# Patient Record
Sex: Female | Born: 1980 | Race: White | Hispanic: No | Marital: Single | Smoking: Current every day smoker
Health system: Southern US, Community
[De-identification: ages and names within clinical notes are randomized; demographics above are authoritative.]

## PROBLEM LIST (undated history)

## (undated) DIAGNOSIS — F4310 Post-traumatic stress disorder, unspecified: Secondary | ICD-10-CM

## (undated) DIAGNOSIS — M7730 Calcaneal spur, unspecified foot: Secondary | ICD-10-CM

## (undated) DIAGNOSIS — F419 Anxiety disorder, unspecified: Secondary | ICD-10-CM

## (undated) DIAGNOSIS — N730 Acute parametritis and pelvic cellulitis: Secondary | ICD-10-CM

## (undated) DIAGNOSIS — N200 Calculus of kidney: Secondary | ICD-10-CM

## (undated) DIAGNOSIS — R091 Pleurisy: Secondary | ICD-10-CM

## (undated) DIAGNOSIS — S322XXA Fracture of coccyx, initial encounter for closed fracture: Secondary | ICD-10-CM

## (undated) DIAGNOSIS — F909 Attention-deficit hyperactivity disorder, unspecified type: Secondary | ICD-10-CM

## (undated) DIAGNOSIS — T7840XA Allergy, unspecified, initial encounter: Secondary | ICD-10-CM

## (undated) DIAGNOSIS — G4700 Insomnia, unspecified: Secondary | ICD-10-CM

## (undated) DIAGNOSIS — M79672 Pain in left foot: Secondary | ICD-10-CM

## (undated) HISTORY — DX: Attention-deficit hyperactivity disorder, unspecified type: F90.9

## (undated) HISTORY — DX: Insomnia, unspecified: G47.00

## (undated) HISTORY — DX: Post-traumatic stress disorder, unspecified: F43.10

## (undated) HISTORY — DX: Fracture of coccyx, initial encounter for closed fracture: S32.2XXA

## (undated) HISTORY — DX: Calculus of kidney: N20.0

## (undated) HISTORY — PX: OTHER SURGICAL HISTORY: SHX169

## (undated) HISTORY — PX: TUBAL LIGATION: SHX77

## (undated) HISTORY — DX: Calcaneal spur, unspecified foot: M77.30

## (undated) HISTORY — DX: Anxiety disorder, unspecified: F41.9

## (undated) HISTORY — DX: Allergy, unspecified, initial encounter: T78.40XA

---

## 2004-12-19 ENCOUNTER — Ambulatory Visit: Payer: Self-pay

## 2005-01-10 ENCOUNTER — Observation Stay: Payer: Self-pay

## 2005-01-24 ENCOUNTER — Inpatient Hospital Stay: Payer: Self-pay

## 2006-03-19 ENCOUNTER — Emergency Department: Payer: Self-pay | Admitting: Emergency Medicine

## 2007-05-16 ENCOUNTER — Emergency Department: Payer: Self-pay | Admitting: Emergency Medicine

## 2007-07-04 ENCOUNTER — Emergency Department: Payer: Self-pay | Admitting: Emergency Medicine

## 2007-11-18 ENCOUNTER — Other Ambulatory Visit: Payer: Self-pay

## 2007-11-18 ENCOUNTER — Emergency Department: Payer: Self-pay | Admitting: Emergency Medicine

## 2008-02-29 ENCOUNTER — Emergency Department: Payer: Self-pay | Admitting: Emergency Medicine

## 2008-09-24 ENCOUNTER — Emergency Department: Payer: Self-pay | Admitting: Emergency Medicine

## 2015-09-10 ENCOUNTER — Encounter: Payer: Self-pay | Admitting: Emergency Medicine

## 2015-09-10 ENCOUNTER — Emergency Department
Admission: EM | Admit: 2015-09-10 | Discharge: 2015-09-10 | Disposition: A | Discharge status: Home / Self Care | Payer: Medicaid Other | Attending: Emergency Medicine | Admitting: Emergency Medicine

## 2015-09-10 ENCOUNTER — Emergency Department: Payer: Medicaid Other

## 2015-09-10 DIAGNOSIS — R109 Unspecified abdominal pain: Secondary | ICD-10-CM

## 2015-09-10 DIAGNOSIS — M549 Dorsalgia, unspecified: Secondary | ICD-10-CM | POA: Diagnosis not present

## 2015-09-10 DIAGNOSIS — R1032 Left lower quadrant pain: Secondary | ICD-10-CM | POA: Insufficient documentation

## 2015-09-10 DIAGNOSIS — R319 Hematuria, unspecified: Secondary | ICD-10-CM | POA: Insufficient documentation

## 2015-09-10 DIAGNOSIS — Z3202 Encounter for pregnancy test, result negative: Secondary | ICD-10-CM | POA: Diagnosis not present

## 2015-09-10 DIAGNOSIS — Z720 Tobacco use: Secondary | ICD-10-CM | POA: Diagnosis not present

## 2015-09-10 LAB — URINALYSIS COMPLETE WITH MICROSCOPIC (ARMC ONLY)
Bilirubin Urine: NEGATIVE
Glucose, UA: NEGATIVE mg/dL
KETONES UR: NEGATIVE mg/dL
Nitrite: NEGATIVE
PH: 8 (ref 5.0–8.0)
Protein, ur: NEGATIVE mg/dL
Specific Gravity, Urine: 1.012 (ref 1.005–1.030)

## 2015-09-10 LAB — COMPREHENSIVE METABOLIC PANEL
ALK PHOS: 47 U/L (ref 38–126)
ALT: 14 U/L (ref 14–54)
AST: 34 U/L (ref 15–41)
Albumin: 4 g/dL (ref 3.5–5.0)
Anion gap: 9 (ref 5–15)
BILIRUBIN TOTAL: 1.3 mg/dL — AB (ref 0.3–1.2)
BUN: 14 mg/dL (ref 6–20)
CALCIUM: 9.5 mg/dL (ref 8.9–10.3)
CHLORIDE: 106 mmol/L (ref 101–111)
CO2: 27 mmol/L (ref 22–32)
CREATININE: 0.83 mg/dL (ref 0.44–1.00)
Glucose, Bld: 81 mg/dL (ref 65–99)
Potassium: 4.1 mmol/L (ref 3.5–5.1)
Sodium: 142 mmol/L (ref 135–145)
TOTAL PROTEIN: 7.1 g/dL (ref 6.5–8.1)

## 2015-09-10 LAB — CBC WITH DIFFERENTIAL/PLATELET
BASOS ABS: 0.2 10*3/uL — AB (ref 0–0.1)
BASOS PCT: 2 %
EOS ABS: 0.4 10*3/uL (ref 0–0.7)
EOS PCT: 4 %
HCT: 38.6 % (ref 35.0–47.0)
Hemoglobin: 12.7 g/dL (ref 12.0–16.0)
LYMPHS PCT: 19 %
Lymphs Abs: 2.1 10*3/uL (ref 1.0–3.6)
MCH: 30 pg (ref 26.0–34.0)
MCHC: 33 g/dL (ref 32.0–36.0)
MCV: 90.7 fL (ref 80.0–100.0)
MONO ABS: 0.8 10*3/uL (ref 0.2–0.9)
Monocytes Relative: 8 %
Neutro Abs: 7.5 10*3/uL — ABNORMAL HIGH (ref 1.4–6.5)
Neutrophils Relative %: 67 %
PLATELETS: 319 10*3/uL (ref 150–440)
RBC: 4.25 MIL/uL (ref 3.80–5.20)
RDW: 13.7 % (ref 11.5–14.5)
WBC: 11.1 10*3/uL — AB (ref 3.6–11.0)

## 2015-09-10 LAB — PREGNANCY, URINE: PREG TEST UR: NEGATIVE

## 2015-09-10 MED ORDER — SODIUM CHLORIDE 0.9 % IV BOLUS (SEPSIS)
1000.0000 mL | Freq: Once | INTRAVENOUS | Status: AC
  Administered 2015-09-10: 1000 mL via INTRAVENOUS

## 2015-09-10 MED ORDER — KETOROLAC TROMETHAMINE 30 MG/ML IJ SOLN
30.0000 mg | Freq: Once | INTRAMUSCULAR | Status: AC
  Administered 2015-09-10: 30 mg via INTRAVENOUS
  Filled 2015-09-10: qty 1

## 2015-09-10 MED ORDER — TRAMADOL HCL 50 MG PO TABS
50.0000 mg | ORAL_TABLET | Freq: Once | ORAL | Status: AC
  Administered 2015-09-10: 50 mg via ORAL
  Filled 2015-09-10: qty 1

## 2015-09-10 NOTE — ED Notes (Addendum)
Pt states yesterday she had sudden onset back and flank pain. Pt states pain feels like kidney stone pain, hx of kidney stones at this time. Pt states she is unable to tell which side specifically d/t the pain, but states it is on both sides and in her lower back. Pt states pain with urination at this time.

## 2015-09-10 NOTE — ED Provider Notes (Addendum)
[facility] Emergency Department Provider Note  ____________________________________________   I have reviewed the triage vital signs and the nursing notes.   HISTORY  Chief Complaint Flank Pain and Back Pain    HPI Caitlin Reid is a 34 y.o. female with a history of kidney stones states that she believes she has a kidney stone. Sudden onset left flank and back pain. She denies dysuria or urinary frequency. She has had no fever no chills. She felt nauseated but had no vomiting. Has had kidney stones in the past. Feels similar to that.Denies history of ovarian cyst, has not missed a period, has a history of bilateral tubal ligation.  History reviewed. No pertinent past medical history.  There are no active problems to display for this patient.   Past Surgical History  Procedure Laterality Date  . Cesarean section    . Bone spur    . Tubal ligation      No current outpatient prescriptions on file.  Allergies Tetracyclines & related and Doxycycline  History reviewed. No pertinent family history.  Social History Social History  Substance Use Topics  . Smoking status: Current Every Day Smoker  . Smokeless tobacco: Never Used  . Alcohol Use: No    Review of Systems Constitutional: No fever/chills Eyes: No visual changes. ENT: No sore throat. No stiff neck no neck pain Cardiovascular: Denies chest pain. Respiratory: Denies shortness of breath. Gastrointestinal:   no vomiting.  No diarrhea.  No constipation. Genitourinary: Negative for dysuria. Musculoskeletal: Negative lower extremity swelling Skin: Negative for rash. Neurological: Negative for headaches, focal weakness or numbness. 10-point ROS otherwise negative.  ____________________________________________   PHYSICAL EXAM:  VITAL SIGNS: ED Triage Vitals  Enc Vitals Group     BP 09/10/15 1320 130/86 mmHg     Pulse Rate 09/10/15 1320 112     Resp 09/10/15 1320 24     Temp  09/10/15 1320 98.2 F (36.8 C)     Temp Source 09/10/15 1320 Oral     SpO2 09/10/15 1320 100 %     Weight 09/10/15 1320 150 lb (68.04 kg)     Height 09/10/15 1320 5\' 2"  (1.575 m)     Head Cir --      Peak Flow --      Pain Score 09/10/15 1320 10     Pain Loc --      Pain Edu? --      Excl. in GC? --     Constitutional: Alert and oriented. Well appearing and in no acute distress. Appears uncomfortable but nontoxic Eyes: Conjunctivae are normal. PERRL. EOMI. Head: Atraumatic. Nose: No congestion/rhinnorhea. Mouth/Throat: Mucous membranes are moist.  Oropharynx non-erythematous. Neck: No stridor.   Nontender with no meningismus Cardiovascular: Normal rate, regular rhythm. Grossly normal heart sounds.  Good peripheral circulation. Respiratory: Normal respiratory effort.  No retractions. Lungs CTAB. Gastrointestinal: Soft , left-sided abdominal discomfort noted lower. No distention. No guarding no rebound Back:  There is no focal tenderness or step off there is no midline tenderness there are no lesions noted. there is left CVA tenderness Musculoskeletal: No lower extremity tenderness. No joint effusions, no DVT signs strong distal pulses no edema Neurologic:  Normal speech and language. No gross focal neurologic deficits are appreciated.  Skin:  Skin is warm, dry and intact. No rash noted. Psychiatric: Mood and affect are normal. Speech and behavior are normal.  ____________________________________________   LABS (all labs ordered are listed, but only abnormal results are displayed)  Labs Reviewed  URINALYSIS COMPLETEWITH MICROSCOPIC ([facility] ONLY) - Abnormal; Notable for the following:    Color, Urine YELLOW (*)    APPearance HAZY (*)    Hgb urine dipstick 3+ (*)    Leukocytes, UA TRACE (*)    Bacteria, UA RARE (*)    Squamous Epithelial / LPF 0-5 (*)    All other components within normal limits  CBC WITH DIFFERENTIAL/PLATELET  COMPREHENSIVE METABOLIC PANEL  PREGNANCY, URINE   POC URINE PREG, ED  POC URINE PREG, ED   ____________________________________________  EKG   ____________________________________________  RADIOLOGY  Personally reviewed ____________________________________________   PROCEDURES  Procedure(s) performed: None  Critical Care performed: None  ____________________________________________   INITIAL IMPRESSION / ASSESSMENT AND PLAN / ED COURSE  Pertinent labs & imaging results that were available during my care of the patient were reviewed by me and considered in my medical decision making (see chart for details).  Patient presents with hematuria, flank pain, lower left abdominal discomfort. Differential does include kidney stone is most likely also is possible diverticulitis, ovarian cyst or possible as well although lower suspicion for all of these entities aside from kidney stone. We'll obtain imaging given that I do not have any recent imaging for this patient and we will check blood work give her pain medications and reassess. ____________________________________________   ----------------------------------------- 4:05 PM on 09/10/2015 -----------------------------------------  Patient texting on her phone in no acute distress. Vital signs are reassuring. There is hematuria but no evidence of UTI and culture will be sent. No dysuria no urinary frequency. Patient requesting "something stronger" for pain. CT scan shows no evidence of obstructive stone or ureteral stone. Aside from hematuria patient's presentation thus far as reassuring. I did offer her a pelvic ultrasound and GU exam to rule out ovarian pathology but she declines at this time. She knows I cannot fully evaluate the structures without further investigation but she would prefer not to have that. CT is normal for ovarian pathology in any event to the extent that that is helpful. I have investigated the patient's [place] [facility] provider database for the last 2 years. 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 This is not to mention the numerous prescriptions for alprazolam, Adderall, given this extensive controlled substance history I do not feel comfortable giving the patient a prescription for narcotic pain medication at this time.   FINAL CLINICAL IMPRESSION(S) / ED DIAGNOSES  Final diagnoses:  Flank pain     Jeanmarie Plant, MD 09/10/15 1411  Jeanmarie Plant, MD 09/10/15 [phone]

## 2015-09-10 NOTE — ED Notes (Signed)
Discussed discharge instructions and follow-up care with patient. No questions or concerns at this time. Pt stable at discharge.  

## 2015-09-10 NOTE — Discharge Instructions (Signed)
Flank Pain °Flank pain refers to pain that is located on the side of the body between the upper abdomen and the back. The pain may occur over a short period of time (acute) or may be long-term or reoccurring (chronic). It may be mild or severe. Flank pain can be caused by many things. °CAUSES  °Some of the more common causes of flank pain include: °· Muscle strains.   °· Muscle spasms.   °· A disease of your spine (vertebral disk disease).   °· A lung infection (pneumonia).   °· Fluid around your lungs (pulmonary edema).   °· A kidney infection.   °· Kidney stones.   °· A very painful skin rash caused by the chickenpox virus (shingles).   °· Gallbladder disease.   °HOME CARE INSTRUCTIONS  °Home care will depend on the cause of your pain. In general, °· Rest as directed by your caregiver. °· Drink enough fluids to keep your urine clear or pale yellow. °· Only take over-the-counter or prescription medicines as directed by your caregiver. Some medicines may help relieve the pain. °· Tell your caregiver about any changes in your pain. °· Follow up with your caregiver as directed. °SEEK IMMEDIATE MEDICAL CARE IF:  °· Your pain is not controlled with medicine.   °· You have new or worsening symptoms. °· Your pain increases.   °· You have abdominal pain.   °· You have shortness of breath.   °· You have persistent nausea or vomiting.   °· You have swelling in your abdomen.   °· You feel faint or pass out.   °· You have blood in your urine. °· You have a fever or persistent symptoms for more than 2-3 days. °· You have a fever and your symptoms suddenly get worse. °MAKE SURE YOU:  °· Understand these instructions. °· Will watch your condition. °· Will get help right away if you are not doing well or get worse. °  °This information is not intended to replace advice given to you by your health care provider. Make sure you discuss any questions you have with your health care provider. °  °Document Released: 12/26/2005 Document  Revised: 07/29/2012 Document Reviewed: 06/18/2012 °Elsevier Interactive Patient Education ©2016 Else[employer] ° °

## 2015-09-15 ENCOUNTER — Ambulatory Visit
Admission: EM | Admit: 2015-09-15 | Discharge: 2015-09-15 | Disposition: A | Discharge status: Home / Self Care | Payer: Medicaid Other | Attending: Family Medicine | Admitting: Family Medicine

## 2015-09-15 DIAGNOSIS — R0981 Nasal congestion: Secondary | ICD-10-CM | POA: Diagnosis not present

## 2015-09-15 DIAGNOSIS — J069 Acute upper respiratory infection, unspecified: Secondary | ICD-10-CM | POA: Diagnosis not present

## 2015-09-15 HISTORY — DX: Pleurisy: R09.1

## 2015-09-15 MED ORDER — BENZONATATE 100 MG PO CAPS: 100.0000 mg | ORAL_CAPSULE | Freq: Three times a day (TID) | ORAL | Status: DC

## 2015-09-15 MED ORDER — FLUTICASONE PROPIONATE 50 MCG/ACT NA SUSP: 1.0000 | Freq: Every day | NASAL | Status: DC

## 2015-09-15 MED ORDER — PREDNISONE 10 MG PO TABS: 40.0000 mg | ORAL_TABLET | Freq: Every day | ORAL | Status: DC

## 2015-09-15 NOTE — Discharge Instructions (Signed)
Upper Respiratory Infection, Adult Most upper respiratory infections (URIs) are caused by a virus. A URI affects the nose, throat, and upper air passages. The most common type of URI is often called "the common cold." HOME CARE   Take medicines only as told by your doctor.  Gargle warm saltwater or take cough drops to comfort your throat as told by your doctor.  Use a warm mist humidifier or inhale steam from a shower to increase air moisture. This may make it easier to breathe.  Drink enough fluid to keep your pee (urine) clear or pale yellow.  Eat soups and other clear broths.  Have a healthy diet.  Rest as needed.  Go back to work when your fever is gone or your doctor says it is okay.  You may need to stay home longer to avoid giving your URI to others.  You can also wear a face mask and wash your hands often to prevent spread of the virus.  Use your inhaler more if you have asthma.  Do not use any tobacco products, including cigarettes, chewing tobacco, or electronic cigarettes. If you need help quitting, ask your doctor. GET HELP IF:  You are getting worse, not better.  Your symptoms are not helped by medicine.  You have chills.  You are getting more short of breath.  You have brown or red mucus.  You have yellow or brown discharge from your nose.  You have pain in your face, especially when you bend forward.  You have a fever.  You have puffy (swollen) neck glands.  You have pain while swallowing.  You have white areas in the back of your throat. GET HELP RIGHT AWAY IF:   You have very bad or constant:  Headache.  Ear pain.  Pain in your forehead, behind your eyes, and over your cheekbones (sinus pain).  Chest pain.  You have long-lasting (chronic) lung disease and any of the following:  Wheezing.  Long-lasting cough.  Coughing up blood.  A change in your usual mucus.  You have a stiff neck.  You have changes in  your:  Vision.  Hearing.  Thinking.  Mood. MAKE SURE YOU:   Understand these instructions.  Will watch your condition.  Will get help right away if you are not doing well or get worse.   This information is not intended to replace advice given to you by your health care provider. Make sure you discuss any questions you have with your health care provider.  Your vital signs or normal and your exam supports treating you for URI, viral illness. No antibiotics are indicated and will not help symptoms at present.  Rest, push fluids, take OTC meds for s/s management. Take Prednsione, flonase, tessalon perles as directed. Return in 3 days for recheck if symptoms have not improved or resolved. Go to ER for new or worsening issues or concerns.    Document Released: 04/22/2008 Document Revised: 03/21/2015 Document Reviewed: 02/09/2014 Elsevier Interactive Patient Education [employer]

## 2015-09-15 NOTE — ED Notes (Signed)
Patient states that 3 days ago she developed a cough, with congestion and she states that she feels like she cannot breathe because she has so much nasal congestion. She states that she has had a headache, runny nose, and wheezing. She states that her symptoms have gotten progressively worse.

## 2015-09-15 NOTE — ED Provider Notes (Signed)
CSN: [id number]     Arrival date & time 09/15/15  1606 History   First MD Initiated Contact with Patient 09/15/15 1746     Chief Complaint  Patient presents with  . URI   (Consider location/radiation/quality/duration/timing/severity/associated sxs/prior Treatment) HPI Comments: Pt states she just moved here form [place]. Was recently seen in ER( 5 days prior) for unrelated complaint. Pt states she stopped smoking weeks ago. Deneis recent illness expsoure  Patient is a 34 y.o. female presenting with URI. The history is provided by the patient. No language interpreter was used.  URI Presenting symptoms: congestion, cough and sore throat   Presenting symptoms: no fever   Severity:  Moderate Onset quality:  Sudden Duration:  3 days Timing:  Constant Progression:  Worsening Chronicity:  Recurrent Relieved by:  Nothing Ineffective treatments:  Rest and OTC medications Associated symptoms: myalgias and sneezing   Associated symptoms: no arthralgias, no headaches, no neck pain, no sinus pain, no swollen glands and no wheezing   Risk factors: no sick contacts     Past Medical History  Diagnosis Date  . Pleurisy     Patient reports that she has had this twice   Past Surgical History  Procedure Laterality Date  . Cesarean section    . Bone spur    . Tubal ligation     History reviewed. No pertinent family history. Social History  Substance Use Topics  . Smoking status: Current Every Day Smoker  . Smokeless tobacco: Never Used  . Alcohol Use: No   OB History    No data available     Review of Systems  Constitutional: Positive for chills. Negative for fever.  HENT: Positive for congestion, sinus pressure, sneezing and sore throat.   Eyes: Negative.   Respiratory: Positive for cough. Negative for choking, shortness of breath and wheezing.   Cardiovascular: Negative.   Gastrointestinal: Negative.   Endocrine: Negative.   Genitourinary: Negative.   Musculoskeletal:  Positive for myalgias. Negative for arthralgias and neck pain.  Skin: Negative for rash.  Allergic/Immunologic: Positive for environmental allergies.  Neurological: Negative for headaches.  Hematological: Negative.   Psychiatric/Behavioral: Negative.   All other systems reviewed and are negative.   Allergies  Doxycycline; Tetracyclines & related; and Other  Home Medications   Prior to Admission medications   Medication Sig Start Date End Date Taking? Authorizing Provider  Multiple Vitamins-Minerals (MULTIVITAMIN GUMMIES ADULT PO) Take 2 each by mouth daily.   Yes Historical Provider, MD  benzonatate (TESSALON) 100 MG capsule Take 1 capsule (100 mg total) by mouth every 8 (eight) hours. 09/15/15   Abbagale Goguen, NP  fluticasone (FLONASE) 50 MCG/ACT nasal spray Place 1 spray into both nostrils daily. 09/15/15   Clancy Gourd, NP  predniSONE (DELTASONE) 10 MG tablet Take 4 tablets (40 mg total) by mouth daily. 09/15/15   Clancy Gourd, NP   Meds Ordered and Administered this Visit  Medications - No data to display  BP 134/79 mmHg  Pulse 84  Temp(Src) 97.5 F (36.4 C) (Tympanic)  Resp 18  Ht 5\' 3"  (1.6 m)  Wt 150 lb (68.04 kg)  BMI 26.58 kg/m2  SpO2 99%  LMP 08/30/2015 (Approximate) No data found.   Physical Exam  Constitutional: She is oriented to person, place, and time. She appears well-developed and well-nourished. She is active and cooperative. No distress.  HENT:  Head: Normocephalic.  Right Ear: Tympanic membrane is retracted.  Left Ear: Tympanic membrane is retracted.  Nose: Mucosal edema present. No  rhinorrhea. Right sinus exhibits no maxillary sinus tenderness and no frontal sinus tenderness. Left sinus exhibits no maxillary sinus tenderness and no frontal sinus tenderness.  Mouth/Throat: Uvula is midline and oropharynx is clear and moist.  Eyes: Conjunctivae, EOM and lids are normal. Pupils are equal, round, and reactive to light.  Neck: Normal range  of motion. No tracheal deviation present.  Cardiovascular: Normal rate, regular rhythm, normal heart sounds and normal pulses.   No murmur heard. Pulmonary/Chest: Effort normal and breath sounds normal. She has no decreased breath sounds.  Abdominal: Soft. Bowel sounds are normal. There is no tenderness.  Musculoskeletal: Normal range of motion.  Lymphadenopathy:    She has no cervical adenopathy.  Neurological: She is alert and oriented to person, place, and time. No cranial nerve deficit or sensory deficit. GCS eye subscore is 4. GCS verbal subscore is 5. GCS motor subscore is 6.  Skin: Skin is warm and dry. No rash noted.  Psychiatric: She has a normal mood and affect. Her speech is normal and behavior is normal.  Nursing note and vitals reviewed.   ED Course  Procedures (including critical care time)  Labs Review Labs Reviewed - No data to display  Imaging Review No results found.    MDM   1. URI (upper respiratory infection)   2. Nasal congestion     Your vital signs or normal and your exam supports treating you for URI, viral illness. No antibiotics are indicated and will not help symptoms at present.  Rest, push fluids, take OTC meds for s/s management. Take Prednsione, flonase, tessalon perles as directed. Return in 3 days for recheck if symptoms have not improved or resolved. Go to ER for new or worsening issues or concerns. Pt verbalized understanding to this provider.  Clancy Gourd, NP 09/15/15 2135

## 2015-10-31 ENCOUNTER — Encounter: Payer: Self-pay | Admitting: Emergency Medicine

## 2015-10-31 ENCOUNTER — Emergency Department: Payer: Medicaid Other

## 2015-10-31 ENCOUNTER — Emergency Department
Admission: EM | Admit: 2015-10-31 | Discharge: 2015-10-31 | Disposition: A | Discharge status: Home / Self Care | Payer: Medicaid Other | Attending: Emergency Medicine | Admitting: Emergency Medicine

## 2015-10-31 DIAGNOSIS — Y998 Other external cause status: Secondary | ICD-10-CM | POA: Diagnosis not present

## 2015-10-31 DIAGNOSIS — Y9289 Other specified places as the place of occurrence of the external cause: Secondary | ICD-10-CM | POA: Insufficient documentation

## 2015-10-31 DIAGNOSIS — F17210 Nicotine dependence, cigarettes, uncomplicated: Secondary | ICD-10-CM | POA: Diagnosis not present

## 2015-10-31 DIAGNOSIS — W108XXA Fall (on) (from) other stairs and steps, initial encounter: Secondary | ICD-10-CM | POA: Insufficient documentation

## 2015-10-31 DIAGNOSIS — S3992XA Unspecified injury of lower back, initial encounter: Secondary | ICD-10-CM | POA: Diagnosis present

## 2015-10-31 DIAGNOSIS — S20229A Contusion of unspecified back wall of thorax, initial encounter: Secondary | ICD-10-CM

## 2015-10-31 DIAGNOSIS — Z7952 Long term (current) use of systemic steroids: Secondary | ICD-10-CM | POA: Insufficient documentation

## 2015-10-31 DIAGNOSIS — Z79899 Other long term (current) drug therapy: Secondary | ICD-10-CM | POA: Insufficient documentation

## 2015-10-31 DIAGNOSIS — Y9389 Activity, other specified: Secondary | ICD-10-CM | POA: Diagnosis not present

## 2015-10-31 DIAGNOSIS — Z7951 Long term (current) use of inhaled steroids: Secondary | ICD-10-CM | POA: Insufficient documentation

## 2015-10-31 DIAGNOSIS — S299XXA Unspecified injury of thorax, initial encounter: Secondary | ICD-10-CM | POA: Insufficient documentation

## 2015-10-31 DIAGNOSIS — Z3202 Encounter for pregnancy test, result negative: Secondary | ICD-10-CM | POA: Insufficient documentation

## 2015-10-31 DIAGNOSIS — S300XXA Contusion of lower back and pelvis, initial encounter: Secondary | ICD-10-CM | POA: Insufficient documentation

## 2015-10-31 LAB — POCT PREGNANCY, URINE: PREG TEST UR: NEGATIVE

## 2015-10-31 MED ORDER — IBUPROFEN 800 MG PO TABS
800.0000 mg | ORAL_TABLET | Freq: Once | ORAL | Status: AC
  Administered 2015-10-31: 800 mg via ORAL
  Filled 2015-10-31: qty 1

## 2015-10-31 MED ORDER — TRAMADOL HCL 50 MG PO TABS: 50.0000 mg | ORAL_TABLET | Freq: Four times a day (QID) | ORAL | Status: DC | PRN

## 2015-10-31 MED ORDER — ONDANSETRON 8 MG PO TBDP
8.0000 mg | ORAL_TABLET | Freq: Once | ORAL | Status: AC
  Administered 2015-10-31: 8 mg via ORAL
  Filled 2015-10-31: qty 1

## 2015-10-31 MED ORDER — IBUPROFEN 800 MG PO TABS: 800.0000 mg | ORAL_TABLET | Freq: Three times a day (TID) | ORAL | Status: DC | PRN

## 2015-10-31 MED ORDER — TRAMADOL HCL 50 MG PO TABS
50.0000 mg | ORAL_TABLET | Freq: Once | ORAL | Status: AC
  Administered 2015-10-31: 50 mg via ORAL
  Filled 2015-10-31: qty 1

## 2015-10-31 MED ORDER — CYCLOBENZAPRINE HCL 10 MG PO TABS: 10.0000 mg | ORAL_TABLET | Freq: Three times a day (TID) | ORAL | Status: DC | PRN

## 2015-10-31 MED ORDER — ONDANSETRON HCL 4 MG PO TABS: 4.0000 mg | ORAL_TABLET | Freq: Three times a day (TID) | ORAL | Status: DC | PRN

## 2015-10-31 NOTE — ED Notes (Signed)
AAOx3.  Skin warm and dry.  Ambulates with easy and steady gait.  Posture more upright and stiff, but improved.  D/C home

## 2015-10-31 NOTE — ED Provider Notes (Signed)
[facility] Emergency Department Provider Note  ____________________________________________  Time seen: Approximately 12:49 PM  I have reviewed the triage vital signs and the nursing notes.   HISTORY  Chief Complaint Fall and Back Pain    HPI Caitlin Reid is a 34 y.o. female complain of pain secondary to a fall. She was taken trash down a flight of stairs when she slipped and fell. Patient stated the pain from mid to lower back. He denies any radicular component to this pain he denies any bladder or bowel dysfunction. No palliative measures taken for this complaint. Patient is rating her pain as a 10 over 10. Patient describes the pain as sharp.   Past Medical History  Diagnosis Date  . Pleurisy     Patient reports that she has had this twice    There are no active problems to display for this patient.   Past Surgical History  Procedure Laterality Date  . Cesarean section    . Bone spur    . Tubal ligation      Current Outpatient Rx  Name  Route  Sig  Dispense  Refill  . benzonatate (TESSALON) 100 MG capsule   Oral   Take 1 capsule (100 mg total) by mouth every 8 (eight) hours.   21 capsule   0   . cyclobenzaprine (FLEXERIL) 10 MG tablet   Oral   Take 1 tablet (10 mg total) by mouth every 8 (eight) hours as needed for muscle spasms.   15 tablet   0   . fluticasone (FLONASE) 50 MCG/ACT nasal spray   Each Nare   Place 1 spray into both nostrils daily.   9.9 g   0   . ibuprofen (ADVIL,MOTRIN) 800 MG tablet   Oral   Take 1 tablet (800 mg total) by mouth every 8 (eight) hours as needed.   30 tablet   0   . ibuprofen (ADVIL,MOTRIN) 800 MG tablet   Oral   Take 1 tablet (800 mg total) by mouth every 8 (eight) hours as needed.   30 tablet   0   . Multiple Vitamins-Minerals (MULTIVITAMIN GUMMIES ADULT PO)   Oral   Take 2 each by mouth daily.         . ondansetron (ZOFRAN) 4 MG tablet   Oral   Take 1 tablet (4 mg total) by  mouth every 8 (eight) hours as needed for nausea or vomiting.   12 tablet   0   . predniSONE (DELTASONE) 10 MG tablet   Oral   Take 4 tablets (40 mg total) by mouth daily.   16 tablet   0   . traMADol (ULTRAM) 50 MG tablet   Oral   Take 1 tablet (50 mg total) by mouth every 6 (six) hours as needed for moderate pain.   12 tablet   0   . traMADol (ULTRAM) 50 MG tablet   Oral   Take 1 tablet (50 mg total) by mouth every 6 (six) hours as needed for moderate pain.   12 tablet   0     Allergies Doxycycline; Tetracyclines & related; and Other  No family history on file.  Social History Social History  Substance Use Topics  . Smoking status: Current Every Day Smoker -- 0.33 packs/day    Types: Cigarettes  . Smokeless tobacco: Never Used  . Alcohol Use: No    Review of Systems Constitutional: No fever/chills Eyes: No visual changes. ENT: No sore throat.  Cardiovascular: Denies chest pain. Respiratory: Denies shortness of breath. Gastrointestinal: No abdominal pain.  No nausea, no vomiting.  No diarrhea.  No constipation. Genitourinary: Negative for dysuria. Musculoskeletal: Positive for back pain. Skin: Negative for rash. Neurological: Negative for headaches, focal weakness or numbness. Hematological/Lymphatic: Allergic/Immunilogical: Doxycycline 10-point ROS otherwise negative.  ____________________________________________   PHYSICAL EXAM:  VITAL SIGNS: ED Triage Vitals  Enc Vitals Group     BP 10/31/15 1212 127/87 mmHg     Pulse Rate 10/31/15 1212 69     Resp 10/31/15 1212 18     Temp 10/31/15 1212 98.1 F (36.7 C)     Temp Source 10/31/15 1212 Oral     SpO2 10/31/15 1212 98 %     Weight 10/31/15 1204 150 lb (68.04 kg)     Height 10/31/15 1204 5\' 2"  (1.575 m)     Head Cir --      Peak Flow --      Pain Score 10/31/15 1205 10     Pain Loc --      Pain Edu? --      Excl. in GC? --     Constitutional: Alert and oriented. Well appearing and in no  acute distress. Eyes: Conjunctivae are normal. PERRL. EOMI. Head: Atraumatic. Nose: No congestion/rhinnorhea. Mouth/Throat: Mucous membranes are moist.  Oropharynx non-erythematous. Neck: No stridor.  No cervical spine tenderness to palpation. Hematological/Lymphatic/Immunilogical: No cervical lymphadenopathy. Cardiovascular: Normal rate, regular rhythm. Grossly normal heart sounds.  Good peripheral circulation. Respiratory: Normal respiratory effort.  No retractions. Lungs CTAB. Gastrointestinal: Soft and nontender. No distention. No abdominal bruits. No CVA tenderness. Musculoskeletal: No spinal deformity. There is no abrasion or ecchymosis. Patient has moderate guarding palpation L1-L4. Patient decreased range of motion with flexion. Patient is a 11/02/15. Neurologic:  Normal speech and language. No gross focal neurologic deficits are appreciated. No gait instability. Skin:  Skin is warm, dry and intact. No rash noted. Psychiatric: Mood and affect are normal. Speech and behavior are normal.  ____________________________________________   LABS (all labs ordered are listed, but only abnormal results are displayed)  Labs Reviewed  POCT PREGNANCY, URINE   ____________________________________________  EKG   ____________________________________________  RADIOLOGY  No acute findings for lumbar and right femur x-ray. ____________________________________________   PROCEDURES  Procedure(s) performed: None  Critical Care performed: No  ____________________________________________   INITIAL IMPRESSION / ASSESSMENT AND PLAN / ED COURSE  Pertinent labs & imaging results that were available during my care of the patient were reviewed by me and considered in my medical decision making (see chart for details).  Lumbar contusion. Skull is negative x-ray findings with patient. Patient given a prescription for tramadol, ibuprofen, and Zofran. Patient advised to follow-up  with the [facility] if condition persists.Chartered certified accountant FINAL CLINICAL IMPRESSION(S) / ED DIAGNOSES  Final diagnoses:  Back contusion, unspecified laterality, initial encounter      Marland Kitchen, PA-C 10/31/15 1346  11/02/15, PA-C 10/31/15 1402  11/02/15, MD 10/31/15 [phone]

## 2015-10-31 NOTE — Discharge Instructions (Signed)

## 2015-10-31 NOTE — ED Notes (Signed)
Moving all extremities equally and strong.  Gait is steady.  Posture forward, shoulders stiff and tense.

## 2015-10-31 NOTE — ED Notes (Signed)
Pt states she fell downstairs this am, pain in mid back.

## 2015-11-10 ENCOUNTER — Encounter: Payer: Self-pay | Admitting: Emergency Medicine

## 2015-11-10 ENCOUNTER — Ambulatory Visit: Payer: Medicaid Other

## 2015-11-10 ENCOUNTER — Ambulatory Visit
Admission: EM | Admit: 2015-11-10 | Discharge: 2015-11-10 | Disposition: A | Discharge status: Home / Self Care | Payer: Medicaid Other | Attending: Family Medicine | Admitting: Family Medicine

## 2015-11-10 DIAGNOSIS — S93402A Sprain of unspecified ligament of left ankle, initial encounter: Secondary | ICD-10-CM | POA: Insufficient documentation

## 2015-11-10 DIAGNOSIS — W109XXA Fall (on) (from) unspecified stairs and steps, initial encounter: Secondary | ICD-10-CM | POA: Insufficient documentation

## 2015-11-10 DIAGNOSIS — M79672 Pain in left foot: Secondary | ICD-10-CM | POA: Insufficient documentation

## 2015-11-10 DIAGNOSIS — F17210 Nicotine dependence, cigarettes, uncomplicated: Secondary | ICD-10-CM | POA: Insufficient documentation

## 2015-11-10 MED ORDER — NAPROXEN 500 MG PO TABS: 500.0000 mg | ORAL_TABLET | Freq: Two times a day (BID) | ORAL | Status: DC

## 2015-11-10 MED ORDER — OXYCODONE-ACETAMINOPHEN 5-325 MG PO TABS: 1.0000 | ORAL_TABLET | Freq: Three times a day (TID) | ORAL | Status: DC | PRN

## 2015-11-10 NOTE — ED Notes (Signed)
Patient c/o pain in her left ankle and foot pain after falling down three steps today.

## 2015-11-10 NOTE — ED Provider Notes (Signed)
CSN: [id number]     Arrival date & time 11/10/15  1854 History   First MD Initiated Contact with Patient 11/10/15 1912     Chief Complaint  Patient presents with  . Foot Pain  . Ankle Pain  . Fall   (Consider location/radiation/quality/duration/timing/severity/associated sxs/prior Treatment) HPI Patient presents today with complaints of left ankle and foot pain after falling down a few steps today. Patient states that her pain is medial and lateral ankle and along the arch of her foot. She admits to previous ankle sprain in the past when she played softball. She denies any known ankle fracture foot fracture in the past. She has been using ice on the area. She is unable to weight-bear without pain. She describes a pulling type of pain in the arch of the foot. There is no bruising of her ankle or foot.  Past Medical History  Diagnosis Date  . Pleurisy     Patient reports that she has had this twice   Past Surgical History  Procedure Laterality Date  . Cesarean section    . Bone spur    . Tubal ligation     History reviewed. No pertinent family history. Social History  Substance Use Topics  . Smoking status: Current Every Day Smoker -- 0.33 packs/day    Types: Cigarettes  . Smokeless tobacco: Never Used  . Alcohol Use: No   OB History    No data available     Review of Systems: Negative except mentioned above.   Allergies  Doxycycline; Tetracyclines & related; and Other  Home Medications   Prior to Admission medications   Medication Sig Start Date End Date Taking? Authorizing Provider  benzonatate (TESSALON) 100 MG capsule Take 1 capsule (100 mg total) by mouth every 8 (eight) hours. 09/15/15   Clancy Gourd, NP  cyclobenzaprine (FLEXERIL) 10 MG tablet Take 1 tablet (10 mg total) by mouth every 8 (eight) hours as needed for muscle spasms. 10/31/15   Joni Reining, PA-C  fluticasone (FLONASE) 50 MCG/ACT nasal spray Place 1 spray into both nostrils daily. 09/15/15    Clancy Gourd, NP  ibuprofen (ADVIL,MOTRIN) 800 MG tablet Take 1 tablet (800 mg total) by mouth every 8 (eight) hours as needed. 10/31/15   Joni Reining, PA-C  ibuprofen (ADVIL,MOTRIN) 800 MG tablet Take 1 tablet (800 mg total) by mouth every 8 (eight) hours as needed. 10/31/15   Joni Reining, PA-C  Multiple Vitamins-Minerals (MULTIVITAMIN GUMMIES ADULT PO) Take 2 each by mouth daily.    Historical Provider, MD  naproxen (NAPROSYN) 500 MG tablet Take 1 tablet (500 mg total) by mouth 2 (two) times daily. 11/10/15   Jolene Provost, MD  ondansetron (ZOFRAN) 4 MG tablet Take 1 tablet (4 mg total) by mouth every 8 (eight) hours as needed for nausea or vomiting. 10/31/15 10/30/16  Joni Reining, PA-C  oxyCODONE-acetaminophen (ROXICET) 5-325 MG tablet Take 1 tablet by mouth every 8 (eight) hours as needed for severe pain. 11/10/15   Jolene Provost, MD  predniSONE (DELTASONE) 10 MG tablet Take 4 tablets (40 mg total) by mouth daily. 09/15/15   Clancy Gourd, NP  traMADol (ULTRAM) 50 MG tablet Take 1 tablet (50 mg total) by mouth every 6 (six) hours as needed for moderate pain. 10/31/15   Joni Reining, PA-C  traMADol (ULTRAM) 50 MG tablet Take 1 tablet (50 mg total) by mouth every 6 (six) hours as needed for moderate pain. 10/31/15   Joni Reining, PA-C  Meds Ordered and Administered this Visit  Medications - No data to display  BP 131/72 mmHg  Pulse 99  Temp(Src) 97.6 F (36.4 C) (Tympanic)  Resp 16  Ht 5\' 2"  (1.575 m)  Wt 150 lb (68.04 kg)  BMI 27.43 kg/m2  SpO2 100%  LMP 10/24/2015 (Exact Date) No data found.   Physical Exam   GENERAL: sitting in wheelchair with mild   RESP: CTA B CARD: RRR MSK: Left Ankle/Foot- mild swelling laterally, mild tenderness laterally and medially, ROM limited due to swelling and pain, -Drawer, -Talar Tilt, no tenderness of base of 5th metatarsal, mild tenderness along arch of foot, no ecchymosis noted around foot or ankle, nv intact  NEURO: CN  II-XII grossly intact   ED Course  Procedures (including critical care time)  Labs Review Labs Reviewed - No data to display  Imaging Review Dg Ankle Complete Left  11/10/2015  CLINICAL DATA:  Status post fall with left foot and ankle pain. EXAM: LEFT ANKLE COMPLETE - 3+ VIEW COMPARISON:  None. FINDINGS: There is no evidence of fracture, dislocation, or joint effusion. There is plantar calcaneal spur. Soft tissue swelling is noted in the lateral ankle. IMPRESSION: No acute fracture or dislocation. Soft tissue swelling lateral ankle. Electronically Signed   By: 11/12/2015 M.D.   On: 11/10/2015 19:33   Dg Foot Complete Left  11/10/2015  CLINICAL DATA:  Left foot and ankle pain, fall off a step yesterday EXAM: LEFT FOOT - COMPLETE 3+ VIEW COMPARISON:  None. FINDINGS: Three views of the left foot submitted. No acute fracture or subluxation. There is plantar spur of calcaneus. IMPRESSION: No acute fracture or subluxation.  Plantar spur of calcaneus. Electronically Signed   By: 11/12/2015 M.D.   On: 11/10/2015 19:34    MDM   1. Ankle sprain, left, initial encounter   2. Foot pain, left   Discussed with patient about RICE, patient put in an ankle stirrup, crutches, she is to follow-up with me on Wednesday here unless she has acute worsening symptoms which then she will seek immediate medical attention.  It doesn't appear that she has a full plantar rupture at this time but she is sensitive in the arch of her foot. I will reassess her on Wednesday to see if she needs any further imaging. Patient given Naprosyn and narcotic medication for when necessary use.    Tuesday, MD 11/10/15 2007

## 2015-11-15 ENCOUNTER — Ambulatory Visit
Admission: EM | Admit: 2015-11-15 | Discharge: 2015-11-15 | Disposition: A | Discharge status: Home / Self Care | Payer: Medicaid Other | Attending: Family Medicine | Admitting: Family Medicine

## 2015-11-15 ENCOUNTER — Encounter: Payer: Self-pay | Admitting: Emergency Medicine

## 2015-11-15 DIAGNOSIS — S99922D Unspecified injury of left foot, subsequent encounter: Secondary | ICD-10-CM | POA: Diagnosis not present

## 2015-11-15 HISTORY — DX: Pain in left foot: M79.672

## 2015-11-15 MED ORDER — OXYCODONE-ACETAMINOPHEN 5-325 MG PO TABS: 1.0000 | ORAL_TABLET | Freq: Three times a day (TID) | ORAL | Status: DC | PRN

## 2015-11-15 NOTE — ED Provider Notes (Signed)
Patient presents today for follow-up regarding left ankle/foot injury. Patient still states that her left foot is painful. She has been wearing the ankle stirrup however has been uncomfortable and it. She states most of her pain is in the arch of her foot. Her ankle symptoms have improved. She denies any bruising of the area or any increased swelling. She denies any tingling or numbness of the foot or discoloration. X-rays were negative for any acute fracture. She states that she will need to see her PCP to get an MRI. She is alert he made an appointment to see her PCP for next week. She is currently on crutches and putting minimal weight on the left foot.  ROS: Negative except mentioned above.  Vitals as per Epic.  GENERAL: NAD sitting in wheelchair MSK: L Foot/Ankle- no ecchymosis, moderate tenderness along the arch of the foot, ankle has full range of motion, mild medial ankle tenderness, no tenderness at the base of the fifth metatarsal, NV intact  A/P: L Ankle/Foot Injury- would recommend patient have further imaging with MRI, patient will have to see PCP according to the patient to be able to get an MRI ordered, would follow-up with podiatry if she needs to see someone locally like Dr. Ether Griffins. She is more comfortable in an Ace wrap and on crutches. Doesn't appear that she would be able to tolerate a walking boot at this time. If any acute worsening symptoms she will seek medical attention as discussed. Will refill her pain medication to use for moderate to severe pain.    Jolene Provost, MD 11/15/15 [phone]

## 2015-11-15 NOTE — ED Notes (Signed)
Left foot pain recheck with Dr. Allena Katz. Pain still continuing in arch of foot shoots up ankle.

## 2015-12-07 ENCOUNTER — Emergency Department: Payer: Medicaid Other

## 2015-12-07 ENCOUNTER — Emergency Department
Admission: EM | Admit: 2015-12-07 | Discharge: 2015-12-07 | Disposition: A | Discharge status: Home / Self Care | Payer: Medicaid Other | Attending: Emergency Medicine | Admitting: Emergency Medicine

## 2015-12-07 DIAGNOSIS — N12 Tubulo-interstitial nephritis, not specified as acute or chronic: Secondary | ICD-10-CM | POA: Diagnosis not present

## 2015-12-07 DIAGNOSIS — Z7952 Long term (current) use of systemic steroids: Secondary | ICD-10-CM | POA: Diagnosis not present

## 2015-12-07 DIAGNOSIS — Z79899 Other long term (current) drug therapy: Secondary | ICD-10-CM | POA: Insufficient documentation

## 2015-12-07 DIAGNOSIS — Z7951 Long term (current) use of inhaled steroids: Secondary | ICD-10-CM | POA: Insufficient documentation

## 2015-12-07 DIAGNOSIS — R109 Unspecified abdominal pain: Secondary | ICD-10-CM | POA: Diagnosis present

## 2015-12-07 DIAGNOSIS — Z791 Long term (current) use of non-steroidal anti-inflammatories (NSAID): Secondary | ICD-10-CM | POA: Diagnosis not present

## 2015-12-07 DIAGNOSIS — F17210 Nicotine dependence, cigarettes, uncomplicated: Secondary | ICD-10-CM | POA: Diagnosis not present

## 2015-12-07 DIAGNOSIS — Z3202 Encounter for pregnancy test, result negative: Secondary | ICD-10-CM | POA: Diagnosis not present

## 2015-12-07 LAB — CBC
HCT: 45.1 % (ref 35.0–47.0)
Hemoglobin: 14.8 g/dL (ref 12.0–16.0)
MCH: 29.2 pg (ref 26.0–34.0)
MCHC: 32.9 g/dL (ref 32.0–36.0)
MCV: 88.6 fL (ref 80.0–100.0)
PLATELETS: 296 10*3/uL (ref 150–440)
RBC: 5.09 MIL/uL (ref 3.80–5.20)
RDW: 14.2 % (ref 11.5–14.5)
WBC: 20.8 10*3/uL — ABNORMAL HIGH (ref 3.6–11.0)

## 2015-12-07 LAB — BASIC METABOLIC PANEL
Anion gap: 10 (ref 5–15)
BUN: 13 mg/dL (ref 6–20)
CO2: 23 mmol/L (ref 22–32)
Calcium: 9.3 mg/dL (ref 8.9–10.3)
Chloride: 103 mmol/L (ref 101–111)
Creatinine, Ser: 0.79 mg/dL (ref 0.44–1.00)
GFR calc Af Amer: >60 mL/min (ref >60)
GLUCOSE: 111 mg/dL — AB (ref 65–99)
POTASSIUM: 4 mmol/L (ref 3.5–5.1)
Sodium: 136 mmol/L (ref 135–145)

## 2015-12-07 LAB — URINALYSIS COMPLETE WITH MICROSCOPIC (ARMC ONLY)
BACTERIA UA: NONE SEEN
BILIRUBIN URINE: NEGATIVE
Glucose, UA: NEGATIVE mg/dL
Ketones, ur: NEGATIVE mg/dL
Nitrite: NEGATIVE
PH: 7 (ref 5.0–8.0)
PROTEIN: NEGATIVE mg/dL
Specific Gravity, Urine: 1.008 (ref 1.005–1.030)

## 2015-12-07 LAB — POCT PREGNANCY, URINE: Preg Test, Ur: NEGATIVE

## 2015-12-07 MED ORDER — ONDANSETRON 8 MG PO TBDP: 8.0000 mg | ORAL_TABLET | Freq: Three times a day (TID) | ORAL | Status: DC | PRN

## 2015-12-07 MED ORDER — SODIUM CHLORIDE 0.9 % IV BOLUS (SEPSIS)
1000.0000 mL | Freq: Once | INTRAVENOUS | Status: AC
  Administered 2015-12-07: 1000 mL via INTRAVENOUS

## 2015-12-07 MED ORDER — DEXTROSE 5 % IV SOLN
1.0000 g | Freq: Once | INTRAVENOUS | Status: AC
  Administered 2015-12-07: 1 g via INTRAVENOUS
  Filled 2015-12-07 (×2): qty 10

## 2015-12-07 MED ORDER — SULFAMETHOXAZOLE-TRIMETHOPRIM 800-160 MG PO TABS: 1.0000 | ORAL_TABLET | Freq: Two times a day (BID) | ORAL | Status: DC

## 2015-12-07 MED ORDER — PHENAZOPYRIDINE HCL 200 MG PO TABS: 200.0000 mg | ORAL_TABLET | Freq: Three times a day (TID) | ORAL | Status: DC | PRN

## 2015-12-07 MED ORDER — IOHEXOL 240 MG/ML SOLN
25.0000 mL | Freq: Once | INTRAMUSCULAR | Status: AC | PRN
  Administered 2015-12-07: 25 mL via ORAL

## 2015-12-07 MED ORDER — PHENAZOPYRIDINE HCL 200 MG PO TABS
200.0000 mg | ORAL_TABLET | Freq: Once | ORAL | Status: AC
  Administered 2015-12-07: 200 mg via ORAL
  Filled 2015-12-07: qty 1

## 2015-12-07 MED ORDER — OXYCODONE-ACETAMINOPHEN 5-325 MG PO TABS
2.0000 | ORAL_TABLET | Freq: Once | ORAL | Status: AC
  Administered 2015-12-07: 2 via ORAL
  Filled 2015-12-07: qty 2

## 2015-12-07 MED ORDER — SODIUM CHLORIDE 0.9 % IV SOLN
Freq: Once | INTRAVENOUS | Status: AC
  Administered 2015-12-07: 20:00:00 via INTRAVENOUS

## 2015-12-07 MED ORDER — IOHEXOL 300 MG/ML  SOLN
100.0000 mL | Freq: Once | INTRAMUSCULAR | Status: AC | PRN
Start: 2015-12-07 — End: 2015-12-07
  Administered 2015-12-07: 100 mL via INTRAVENOUS

## 2015-12-07 MED ORDER — HYDROMORPHONE HCL 1 MG/ML IJ SOLN
1.0000 mg | Freq: Once | INTRAMUSCULAR | Status: AC
  Administered 2015-12-07: 1 mg via INTRAVENOUS
  Filled 2015-12-07: qty 1

## 2015-12-07 MED ORDER — ONDANSETRON HCL 4 MG/2ML IJ SOLN
4.0000 mg | Freq: Once | INTRAMUSCULAR | Status: AC
  Administered 2015-12-07: 4 mg via INTRAVENOUS
  Filled 2015-12-07: qty 2

## 2015-12-07 MED ORDER — OXYCODONE-ACETAMINOPHEN 5-325 MG PO TABS: 1.0000 | ORAL_TABLET | Freq: Four times a day (QID) | ORAL | Status: DC | PRN

## 2015-12-07 NOTE — ED Notes (Signed)
Pt arrived to ED with c/o right flank pain since Monday. Pt reports seen at [facility] and given diagnosis of kidney stone. Pt reports increased pain this evening. Pt reports finished pain meds yesterday.

## 2015-12-07 NOTE — ED Provider Notes (Signed)
[facility] Emergency Department Provider Note  ____________________________________________  Time seen: 9:00 PM  I have reviewed the triage vital signs and the nursing notes.   HISTORY  Chief Complaint Flank Pain    HPI Caitlin Reid is a 35 y.o. female who complains of right flank pain radiating to the right lower quadrantthat's been worsening over the past 4 or 5 days. She was seen at [facility] at the onset of the symptoms and diagnosed with a kidney stone on a noncontrast CT scan. However, despite taking pain medications and her symptoms have continued to worsen. She has loss of appetite and some vomiting. She has subjective fevers and chills.  The pain is severe, colicky and achy. Worse with movement. She also complains of severe urinary urgency and dysuria.   Past Medical History  Diagnosis Date  . Pleurisy     Patient reports that she has had this twice  . Foot pain, left      There are no active problems to display for this patient.    Past Surgical History  Procedure Laterality Date  . Cesarean section    . Bone spur    . Tubal ligation       Current Outpatient Rx  Name  Route  Sig  Dispense  Refill  . benzonatate (TESSALON) 100 MG capsule   Oral   Take 1 capsule (100 mg total) by mouth every 8 (eight) hours.   21 capsule   0   . cyclobenzaprine (FLEXERIL) 10 MG tablet   Oral   Take 1 tablet (10 mg total) by mouth every 8 (eight) hours as needed for muscle spasms.   15 tablet   0   . fluticasone (FLONASE) 50 MCG/ACT nasal spray   Each Nare   Place 1 spray into both nostrils daily.   9.9 g   0   . ibuprofen (ADVIL,MOTRIN) 800 MG tablet   Oral   Take 1 tablet (800 mg total) by mouth every 8 (eight) hours as needed.   30 tablet   0   . ibuprofen (ADVIL,MOTRIN) 800 MG tablet   Oral   Take 1 tablet (800 mg total) by mouth every 8 (eight) hours as needed.   30 tablet   0   . Multiple Vitamins-Minerals (MULTIVITAMIN  GUMMIES ADULT PO)   Oral   Take 2 each by mouth daily.         . naproxen (NAPROSYN) 500 MG tablet   Oral   Take 1 tablet (500 mg total) by mouth 2 (two) times daily.   20 tablet   0   . ondansetron (ZOFRAN ODT) 8 MG disintegrating tablet   Oral   Take 1 tablet (8 mg total) by mouth every 8 (eight) hours as needed for nausea or vomiting.   20 tablet   0   . ondansetron (ZOFRAN) 4 MG tablet   Oral   Take 1 tablet (4 mg total) by mouth every 8 (eight) hours as needed for nausea or vomiting.   12 tablet   0   . oxyCODONE-acetaminophen (ROXICET) 5-325 MG tablet   Oral   Take 1 tablet by mouth every 6 (six) hours as needed for severe pain.   12 tablet   0   . phenazopyridine (PYRIDIUM) 200 MG tablet   Oral   Take 1 tablet (200 mg total) by mouth 3 (three) times daily as needed for pain.   10 tablet   0   . predniSONE (  DELTASONE) 10 MG tablet   Oral   Take 4 tablets (40 mg total) by mouth daily.   16 tablet   0   . sulfamethoxazole-trimethoprim (BACTRIM DS) 800-160 MG tablet   Oral   Take 1 tablet by mouth 2 (two) times daily.   14 tablet   0   . traMADol (ULTRAM) 50 MG tablet   Oral   Take 1 tablet (50 mg total) by mouth every 6 (six) hours as needed for moderate pain.   12 tablet   0   . traMADol (ULTRAM) 50 MG tablet   Oral   Take 1 tablet (50 mg total) by mouth every 6 (six) hours as needed for moderate pain.   12 tablet   0      Allergies Doxycycline; Tetracyclines & related; and Other   History reviewed. No pertinent family history.  Social History Social History  Substance Use Topics  . Smoking status: Current Every Day Smoker -- 0.33 packs/day    Types: Cigarettes  . Smokeless tobacco: Never Used  . Alcohol Use: No    Review of Systems  Constitutional:   No fever or chills. No weight changes Eyes:   No blurry vision or double vision.  ENT:   No sore throat. Cardiovascular:   No chest pain. Respiratory:   No dyspnea or  cough. Gastrointestinal:   Positive abdominal pain and vomiting. No diarrhea.  No BRBPR or melena. Genitourinary:   Positive dysuria and urinary frequency and urgency.. Musculoskeletal:   Negative for back pain. No joint swelling or pain. Skin:   Negative for rash. Neurological:   Negative for headaches, focal weakness or numbness. Psychiatric:  No anxiety or depression.   Endocrine:  No hot/cold intolerance, changes in energy, or sleep difficulty.  10-point ROS otherwise negative.  ____________________________________________   PHYSICAL EXAM:  VITAL SIGNS: ED Triage Vitals  Enc Vitals Group     BP 12/07/15 1930 171/107 mmHg     Pulse Rate 12/07/15 1930 114     Resp 12/07/15 1930 16     Temp 12/07/15 1930 98.6 F (37 C)     Temp Source 12/07/15 1930 Oral     SpO2 12/07/15 1930 99 %     Weight 12/07/15 1930 150 lb (68.04 kg)     Height 12/07/15 1930 5\' 2"  (1.575 m)     Head Cir --      Peak Flow --      Pain Score 12/07/15 1933 10     Pain Loc --      Pain Edu? --      Excl. in GC? --     Vital signs reviewed, nursing assessments reviewed.   Constitutional:   Alert and oriented. Mild distress due to pain. Eyes:   No scleral icterus. No conjunctival pallor. PERRL. EOMI ENT   Head:   Normocephalic and atraumatic.   Nose:   No congestion/rhinnorhea. No septal hematoma   Mouth/Throat:   MMM, no pharyngeal erythema. No peritonsillar mass. No uvula shift.   Neck:   No stridor. No SubQ emphysema. No meningismus. Hematological/Lymphatic/Immunilogical:   No cervical lymphadenopathy. Cardiovascular:   RRR. Normal and symmetric distal pulses are present in all extremities. No murmurs, rubs, or gallops. Respiratory:   Normal respiratory effort without tachypnea nor retractions. Breath sounds are clear and equal bilaterally. No wheezes/rales/rhonchi. Gastrointestinal:   Soft with severe and right lower quadrant tenderness. No distention. There is right CVA tenderness.   No rebound, rigidity, or guarding.  Genitourinary:   deferred Musculoskeletal:   Nontender with normal range of motion in all extremities. No joint effusions.  No lower extremity tenderness.  No edema. Neurologic:   Normal speech and language.  CN 2-10 normal. Motor grossly intact. No pronator drift.  Normal gait. No gross focal neurologic deficits are appreciated.  Skin:    Skin is warm, dry and intact. No rash noted.  No petechiae, purpura, or bullae. Psychiatric:   Mood and affect are normal. Speech and behavior are normal. Patient exhibits appropriate insight and judgment.  ____________________________________________    LABS (pertinent positives/negatives) (all labs ordered are listed, but only abnormal results are displayed) Labs Reviewed  CBC - Abnormal; Notable for the following:    WBC 20.8 (*)    All other components within normal limits  BASIC METABOLIC PANEL - Abnormal; Notable for the following:    Glucose, Bld 111 (*)    All other components within normal limits  URINALYSIS COMPLETEWITH MICROSCOPIC ([facility] ONLY) - Abnormal; Notable for the following:    Color, Urine YELLOW (*)    APPearance CLOUDY (*)    Hgb urine dipstick 3+ (*)    Leukocytes, UA 2+ (*)    Squamous Epithelial / LPF 6-30 (*)    All other components within normal limits  URINE CULTURE  POC URINE PREG, ED  POCT PREGNANCY, URINE   ____________________________________________   EKG    ____________________________________________    RADIOLOGY  CT abdomen and pelvis unremarkable  ____________________________________________   PROCEDURES   ____________________________________________   INITIAL IMPRESSION / ASSESSMENT AND PLAN / ED COURSE  Pertinent labs & imaging results that were available during my care of the patient were reviewed by me and considered in my medical decision making (see chart for details).  Patient presents with right lower quadrant pain and tenderness,  subjective fevers and chills, tachycardia, leukocytosis on labs with a white count 20,000. We'll repeat the CT scan this time with IV contrast. IV fluids and Dilaudid and antiemetics.  ----------------------------------------- 11:15 PM on 12/07/2015 -----------------------------------------  Pain controlled, vital signs normalized. CT unremarkable. With the urinary symptoms hematuria and CVA tenderness, I think that this is actually a presentation of pyelonephritis. She is given ceftriaxone IV in the emergency department and I'll start her on Bactrim and have her follow-up with urology. Highly doubt any vascular phenomenon in the abdomen that may be contributing to symptoms.     ____________________________________________   FINAL CLINICAL IMPRESSION(S) / ED DIAGNOSES  Final diagnoses:  Pyelonephritis      Sharman Cheek, MD 12/07/15 2316

## 2015-12-07 NOTE — Discharge Instructions (Signed)

## 2015-12-10 LAB — URINE CULTURE

## 2015-12-27 ENCOUNTER — Other Ambulatory Visit: Payer: Self-pay | Admitting: Emergency Medicine

## 2015-12-27 ENCOUNTER — Emergency Department
Admission: EM | Admit: 2015-12-27 | Discharge: 2015-12-27 | Disposition: A | Discharge status: Home / Self Care | Payer: Medicaid Other | Attending: Emergency Medicine | Admitting: Emergency Medicine

## 2015-12-27 DIAGNOSIS — F17210 Nicotine dependence, cigarettes, uncomplicated: Secondary | ICD-10-CM | POA: Diagnosis not present

## 2015-12-27 DIAGNOSIS — E669 Obesity, unspecified: Secondary | ICD-10-CM | POA: Diagnosis not present

## 2015-12-27 DIAGNOSIS — Z792 Long term (current) use of antibiotics: Secondary | ICD-10-CM | POA: Diagnosis not present

## 2015-12-27 DIAGNOSIS — R4789 Other speech disturbances: Secondary | ICD-10-CM | POA: Insufficient documentation

## 2015-12-27 DIAGNOSIS — Z79899 Other long term (current) drug therapy: Secondary | ICD-10-CM | POA: Insufficient documentation

## 2015-12-27 DIAGNOSIS — R1084 Generalized abdominal pain: Secondary | ICD-10-CM | POA: Diagnosis not present

## 2015-12-27 DIAGNOSIS — Z7952 Long term (current) use of systemic steroids: Secondary | ICD-10-CM | POA: Insufficient documentation

## 2015-12-27 DIAGNOSIS — R112 Nausea with vomiting, unspecified: Secondary | ICD-10-CM | POA: Insufficient documentation

## 2015-12-27 DIAGNOSIS — F419 Anxiety disorder, unspecified: Secondary | ICD-10-CM | POA: Diagnosis not present

## 2015-12-27 DIAGNOSIS — Z7951 Long term (current) use of inhaled steroids: Secondary | ICD-10-CM | POA: Insufficient documentation

## 2015-12-27 DIAGNOSIS — Z791 Long term (current) use of non-steroidal anti-inflammatories (NSAID): Secondary | ICD-10-CM | POA: Diagnosis not present

## 2015-12-27 LAB — URINALYSIS COMPLETE WITH MICROSCOPIC (ARMC ONLY)
Bilirubin Urine: NEGATIVE
Glucose, UA: NEGATIVE mg/dL
Hgb urine dipstick: NEGATIVE
KETONES UR: NEGATIVE mg/dL
NITRITE: NEGATIVE
PH: 7 (ref 5.0–8.0)
PROTEIN: NEGATIVE mg/dL
RBC / HPF: NONE SEEN RBC/hpf (ref 0–5)
Specific Gravity, Urine: 1.015 (ref 1.005–1.030)

## 2015-12-27 MED ORDER — DIPHENHYDRAMINE HCL 50 MG/ML IJ SOLN
25.0000 mg | Freq: Once | INTRAMUSCULAR | Status: AC
  Administered 2015-12-27: 25 mg via INTRAVENOUS
  Filled 2015-12-27: qty 1

## 2015-12-27 MED ORDER — HALOPERIDOL LACTATE 5 MG/ML IJ SOLN
2.5000 mg | Freq: Once | INTRAMUSCULAR | Status: AC
  Administered 2015-12-27: 2.5 mg via INTRAVENOUS
  Filled 2015-12-27: qty 1

## 2015-12-27 MED ORDER — ONDANSETRON HCL 4 MG PO TABS: ORAL_TABLET | ORAL | Status: DC

## 2015-12-27 MED ORDER — KETOROLAC TROMETHAMINE 30 MG/ML IJ SOLN
30.0000 mg | Freq: Once | INTRAMUSCULAR | Status: AC
  Administered 2015-12-27: 30 mg via INTRAVENOUS
  Filled 2015-12-27: qty 1

## 2015-12-27 MED ORDER — SODIUM CHLORIDE 0.9 % IV BOLUS (SEPSIS)
500.0000 mL | INTRAVENOUS | Status: AC
  Administered 2015-12-27: 500 mL via INTRAVENOUS

## 2015-12-27 NOTE — ED Notes (Signed)
Dr. York Cerise went to examine the Pt and she reported not being able to sleep because of excruciating pain and nausea. Dr. York Cerise mentioned to the Pt that he reviewed her previous encounters of this and other hospitals, were she presents with the same symptoms and no direct diagnosis can be reached.

## 2015-12-27 NOTE — ED Provider Notes (Signed)
[facility] Emergency Department Provider Note  ____________________________________________  Time seen: Approximately 4:27 AM  I have reviewed the triage vital signs and the nursing notes.   HISTORY  Chief Complaint Abdominal Pain and Emesis    HPI Caitlin Reid is a 35 y.o. female who reports a history of kidney stones and who has had 7 emergency department visits in the last 6 months (as reported by [facility])presents by private vehicle for evaluation of acute onset severe abdominal pain.  She was quite histrionic upon arrival in triage and during her wait.  She reports that she awoke from sleep at about 10:00 PM with the worst abdominal pain of her life.  She describes it as being generalized in her whole abdomen and stated that she would like someone to put a bullet in her because it hurt so bad.  It is notable that her pulse was in the 60s when it was checked into triage.  She describes persistent nausea and vomiting but while she was waiting for an exam room the nurses discovered that she had her head underneath the sink and was drinking copious amounts of water and then vomiting it up.  She denies fever/chills, chest pain, shortness of breath.  She states that Zofran did nothing for her and that she needs pain medicine before she dies.  Of note, when she was informed out front that it would be a while before she got an exam room she went to sleep in the waiting room until she was placed back into an ED exam room at which point the pain resumed.   Past Medical History  Diagnosis Date  . Pleurisy     Patient reports that she has had this twice  . Foot pain, left     There are no active problems to display for this patient.   Past Surgical History  Procedure Laterality Date  . Cesarean section    . Bone spur    . Tubal ligation      Current Outpatient Rx  Name  Route  Sig  Dispense  Refill  . benzonatate (TESSALON) 100 MG capsule   Oral   Take 1  capsule (100 mg total) by mouth every 8 (eight) hours.   21 capsule   0   . cyclobenzaprine (FLEXERIL) 10 MG tablet   Oral   Take 1 tablet (10 mg total) by mouth every 8 (eight) hours as needed for muscle spasms.   15 tablet   0   . fluticasone (FLONASE) 50 MCG/ACT nasal spray   Each Nare   Place 1 spray into both nostrils daily.   9.9 g   0   . ibuprofen (ADVIL,MOTRIN) 800 MG tablet   Oral   Take 1 tablet (800 mg total) by mouth every 8 (eight) hours as needed.   30 tablet   0   . ibuprofen (ADVIL,MOTRIN) 800 MG tablet   Oral   Take 1 tablet (800 mg total) by mouth every 8 (eight) hours as needed.   30 tablet   0   . Multiple Vitamins-Minerals (MULTIVITAMIN GUMMIES ADULT PO)   Oral   Take 2 each by mouth daily.         . naproxen (NAPROSYN) 500 MG tablet   Oral   Take 1 tablet (500 mg total) by mouth 2 (two) times daily.   20 tablet   0   . ondansetron (ZOFRAN ODT) 8 MG disintegrating tablet   Oral   Take  1 tablet (8 mg total) by mouth every 8 (eight) hours as needed for nausea or vomiting.   20 tablet   0   . ondansetron (ZOFRAN) 4 MG tablet      Take 1-2 tabs by mouth every 8 hours as needed for nausea/vomiting   30 tablet   0   . oxyCODONE-acetaminophen (ROXICET) 5-325 MG tablet   Oral   Take 1 tablet by mouth every 6 (six) hours as needed for severe pain.   12 tablet   0   . phenazopyridine (PYRIDIUM) 200 MG tablet   Oral   Take 1 tablet (200 mg total) by mouth 3 (three) times daily as needed for pain.   10 tablet   0   . predniSONE (DELTASONE) 10 MG tablet   Oral   Take 4 tablets (40 mg total) by mouth daily.   16 tablet   0   . sulfamethoxazole-trimethoprim (BACTRIM DS) 800-160 MG tablet   Oral   Take 1 tablet by mouth 2 (two) times daily.   14 tablet   0   . traMADol (ULTRAM) 50 MG tablet   Oral   Take 1 tablet (50 mg total) by mouth every 6 (six) hours as needed for moderate pain.   12 tablet   0   . traMADol (ULTRAM) 50 MG  tablet   Oral   Take 1 tablet (50 mg total) by mouth every 6 (six) hours as needed for moderate pain.   12 tablet   0     Allergies Doxycycline; Tetracyclines & related; and Other  No family history on file.  Social History Social History  Substance Use Topics  . Smoking status: Current Every Day Smoker -- 0.33 packs/day    Types: Cigarettes  . Smokeless tobacco: Never Used  . Alcohol Use: No    Review of Systems Constitutional: No fever/chills Eyes: No visual changes. ENT: No sore throat. Cardiovascular: Denies chest pain. Respiratory: Denies shortness of breath. Gastrointestinal: Severe generalized abdominal pain with multiple episodes of nausea and vomiting Genitourinary: Negative for dysuria. Musculoskeletal: Negative for back pain. Skin: Negative for rash. Neurological: Negative for headaches, focal weakness or numbness.  10-point ROS otherwise negative.  ____________________________________________   PHYSICAL EXAM:  ED Triage Vitals  Enc Vitals Group     BP 12/27/15 0428 133/119 mmHg     Pulse Rate 12/27/15 0428 62     Resp 12/27/15 0428 16     Temp --      Temp src --      SpO2 12/27/15 0428 100 %     Weight --      Height --      Head Cir --      Peak Flow --      Pain Score 12/27/15 0345 10     Pain Loc --      Pain Edu? --      Excl. in GC? --    Exam is limited by cooperation.  Constitutional: Alert and oriented.  Rocking back and forth in apparent discomfort.  Nontoxic. Eyes: Conjunctivae are normal. PERRL. EOMI. Head: Atraumatic. Nose: No congestion/rhinnorhea. Mouth/Throat: Mucous membranes are moist.  Oropharynx non-erythematous. Neck: No stridor.   Cardiovascular: Normal rate, regular rhythm. Grossly normal heart sounds.  Good peripheral circulation. Respiratory: Normal respiratory effort.  No retractions. Lungs CTAB. Gastrointestinal: Obese.  Soft severe generalized tenderness throughout.  No distention.  Unable to localize due to  the patient's lack of cooperation. Musculoskeletal: No lower extremity  tenderness nor edema.  No joint effusions. Neurologic:  Normal speech and language. No gross focal neurologic deficits are appreciated.  Skin:  Skin is warm, dry and intact. No rash noted. Psychiatric: Mood and affect are anxious with pressured speech.  ____________________________________________   LABS (all labs ordered are listed, but only abnormal results are displayed)  Labs Reviewed  URINALYSIS COMPLETEWITH MICROSCOPIC ([facility] ONLY) - Abnormal; Notable for the following:    Color, Urine YELLOW (*)    APPearance CLOUDY (*)    Leukocytes, UA 3+ (*)    Bacteria, UA FEW (*)    Squamous Epithelial / LPF 0-5 (*)    All other components within normal limits  URINE CULTURE  PREGNANCY, URINE   ____________________________________________  EKG  None ____________________________________________  RADIOLOGY   No results found.  ____________________________________________   PROCEDURES  Procedure(s) performed: None  Critical Care performed: No ____________________________________________   INITIAL IMPRESSION / ASSESSMENT AND PLAN / ED COURSE  Pertinent labs & imaging results that were available during my care of the patient were reviewed by me and considered in my medical decision making (see chart for details).  The patient has had multiple emergency department visits to this facility and to both [facility] over the last couple of months.  She is had multiple CT scans of her abdomen and pelvis, none of which have ever shown anything more than a submillimeter intrarenal stone on her most recent scan at [facility] within the last 2 weeks.  I am concerned that additional imaging would not be in the patient's best interest.  Also, as has been documented previously in ED provider notes, I am concerned about narcotic seeking behavior.  A review of the [place] controlled substance database shows 9  prescriptions for prescription pain medication over the last 6 months as well as prescriptions for Adderall, alprazolam, and dextroamp-amphetamine.  I will treat her pain and nausea with nonnarcotic medications.  Given the refractory nature of her nausea after the Zofran I will proceed to Haldol and Benadryl (of note, the patient is requesting something for pain that will also help her sleep, so this may be a good choice).  I will also give her a dose of tramadol to help with her acute pain.  Due to [facility] lab time we do not have current access to her lab results and we are awaiting a urinalysis but I will reassess after these results are available.  ----------------------------------------- 6:11 AM on 12/27/2015 -----------------------------------------  The patient has been sleeping comfortably since my initial evaluation.  Her labs are generally unremarkable.  Her urinalysis demonstrated 3+ leukocytes but few bacteria and 0-5 wbc's and is nitrite negative.  I sent the urine to culture rather than treating empirically.  Blood work results are reassuring; she does have a mild leukocytosis but without a specific focus of infection.    ----------------------------------------- 6:23 AM on 12/27/2015 -----------------------------------------  Patient is still sleeping comfortably.  Her nurse Sherilyn Cooter and I checked on her together.  She woke up to voice and light touch.  She states that she is feeling much better and smiled when I ask her how she was feeling.  I offered a pelvic exam to further evaluate the generalized abdominal pain even though at has resolved (I saw in prior ED provider notes that she was offered a pelvic exam in the past and declined and understands the risks and benefits).  She stated that she does not want to have the exam today and she knows  to follow up with her regular doctor. ____________________________________________  FINAL CLINICAL IMPRESSION(S) / ED DIAGNOSES  Final diagnoses:   Generalized abdominal pain      NEW MEDICATIONS STARTED DURING THIS VISIT:  New Prescriptions   ONDANSETRON (ZOFRAN) 4 MG TABLET    Take 1-2 tabs by mouth every 8 hours as needed for nausea/vomiting     Loleta Rose, MD 12/27/15 [phone]

## 2015-12-27 NOTE — Discharge Instructions (Signed)
You have been seen in the Emergency Department (ED) for abdominal pain.  Your evaluation did not identify a clear cause of your symptoms but was generally reassuring. ° °Please follow up as instructed above regarding today’s emergent visit and the symptoms that are bothering you. ° °Return to the ED if your abdominal pain worsens or fails to improve, you develop bloody vomiting, bloody diarrhea, you are unable to tolerate fluids due to vomiting, fever greater than 101, or other symptoms that concern you. ° ° °Abdominal Pain, Adult °Many things can cause abdominal pain. Usually, abdominal pain is not caused by a disease and will improve without treatment. It can often be observed and treated at home. Your health care provider will do a physical exam and possibly order blood tests and X-rays to help determine the seriousness of your pain. However, in many cases, more time must pass before a clear cause of the pain can be found. Before that point, your health care provider may not know if you need more testing or further treatment. °HOME CARE INSTRUCTIONS °Monitor your abdominal pain for any changes. The following actions may help to alleviate any discomfort you are experiencing: °· Only take over-the-counter or prescription medicines as directed by your health care provider. °· Do not take laxatives unless directed to do so by your health care provider. °· Try a clear liquid diet (broth, tea, or water) as directed by your health care provider. Slowly move to a bland diet as tolerated. °SEEK MEDICAL CARE IF: °· You have unexplained abdominal pain. °· You have abdominal pain associated with nausea or diarrhea. °· You have pain when you urinate or have a bowel movement. °· You experience abdominal pain that wakes you in the night. °· You have abdominal pain that is worsened or improved by eating food. °· You have abdominal pain that is worsened with eating fatty foods. °· You have a fever. °SEEK IMMEDIATE MEDICAL CARE  IF: °· Your pain does not go away within 2 hours. °· You keep throwing up (vomiting). °· Your pain is felt only in portions of the abdomen, such as the right side or the left lower portion of the abdomen. °· You pass bloody or black tarry stools. °MAKE SURE YOU: °· Understand these instructions. °· Will watch your condition. °· Will get help right away if you are not doing well or get worse. °  °This information is not intended to replace advice given to you by your health care provider. Make sure you discuss any questions you have with your health care provider. °  °Document Released: 08/14/2005 Document Revised: 07/26/2015 Document Reviewed: 07/14/2013 °Elsevier Interactive Patient Education ©2016 [employer] ° °

## 2015-12-27 NOTE — ED Notes (Signed)
Husband was present during discharge instructions and verbalized understanding that further follow-up needs to occur, due to patient's multiple ED visits in the last month.  Pt transported to car via wheelchair and husband to drive her home.

## 2015-12-27 NOTE — ED Notes (Signed)
Pt continues to say that she is in pain and that she wants pain medication "now." Pt has been crying at loud and being very vocal about wanting pain medication. Sherilyn Cooter RN went into the Pt's room and found the Pt drinking water excessively despite of her complaints of having nausea and vomiting after taking nausea medication that according to the Pt "didn't work."

## 2015-12-27 NOTE — ED Notes (Signed)
See paper chart for triage note. Paper chart per system down time procedure.

## 2015-12-27 NOTE — ED Notes (Signed)
See paper chart. Arrived during computer "down time".

## 2015-12-27 NOTE — ED Notes (Signed)
Pt came out to the hall speaking loudly, saying that she is is so much pain that she can not take it any more. MD notified.

## 2015-12-27 NOTE — ED Notes (Signed)
Caitlin Cooter RN went to the the Caitlin Reid's room to give the medication ordered and found the Caitlin Reid sound asleep, Dr. York Cerise was notified.

## 2015-12-28 LAB — URINE CULTURE
Culture: NO GROWTH
Special Requests: NORMAL

## 2016-04-10 ENCOUNTER — Ambulatory Visit: Payer: Medicaid Other | Attending: Pain Medicine | Admitting: Pain Medicine

## 2016-04-10 ENCOUNTER — Encounter: Payer: Self-pay | Admitting: Pain Medicine

## 2016-04-10 VITALS — BP 139/94 | HR 106 | Temp 99.1°F | Resp 18 | Ht 64.0 in | Wt 160.0 lb

## 2016-04-10 DIAGNOSIS — G4700 Insomnia, unspecified: Secondary | ICD-10-CM | POA: Insufficient documentation

## 2016-04-10 DIAGNOSIS — F419 Anxiety disorder, unspecified: Secondary | ICD-10-CM | POA: Diagnosis not present

## 2016-04-10 DIAGNOSIS — F17210 Nicotine dependence, cigarettes, uncomplicated: Secondary | ICD-10-CM | POA: Diagnosis not present

## 2016-04-10 DIAGNOSIS — M79604 Pain in right leg: Secondary | ICD-10-CM | POA: Diagnosis not present

## 2016-04-10 DIAGNOSIS — M25551 Pain in right hip: Secondary | ICD-10-CM | POA: Insufficient documentation

## 2016-04-10 DIAGNOSIS — M533 Sacrococcygeal disorders, not elsewhere classified: Secondary | ICD-10-CM | POA: Diagnosis not present

## 2016-04-10 DIAGNOSIS — G8929 Other chronic pain: Secondary | ICD-10-CM | POA: Diagnosis not present

## 2016-04-10 DIAGNOSIS — F1190 Opioid use, unspecified, uncomplicated: Secondary | ICD-10-CM

## 2016-04-10 DIAGNOSIS — M47816 Spondylosis without myelopathy or radiculopathy, lumbar region: Secondary | ICD-10-CM | POA: Diagnosis not present

## 2016-04-10 DIAGNOSIS — Z79891 Long term (current) use of opiate analgesic: Secondary | ICD-10-CM | POA: Diagnosis not present

## 2016-04-10 DIAGNOSIS — Z79899 Other long term (current) drug therapy: Secondary | ICD-10-CM

## 2016-04-10 DIAGNOSIS — M549 Dorsalgia, unspecified: Secondary | ICD-10-CM | POA: Diagnosis present

## 2016-04-10 DIAGNOSIS — M545 Low back pain, unspecified: Secondary | ICD-10-CM

## 2016-04-10 DIAGNOSIS — M5127 Other intervertebral disc displacement, lumbosacral region: Secondary | ICD-10-CM | POA: Diagnosis not present

## 2016-04-10 DIAGNOSIS — Z0189 Encounter for other specified special examinations: Secondary | ICD-10-CM

## 2016-04-10 DIAGNOSIS — M5136 Other intervertebral disc degeneration, lumbar region: Secondary | ICD-10-CM

## 2016-04-10 DIAGNOSIS — M069 Rheumatoid arthritis, unspecified: Secondary | ICD-10-CM | POA: Insufficient documentation

## 2016-04-10 DIAGNOSIS — M1288 Other specific arthropathies, not elsewhere classified, other specified site: Secondary | ICD-10-CM | POA: Diagnosis not present

## 2016-04-10 DIAGNOSIS — M5126 Other intervertebral disc displacement, lumbar region: Secondary | ICD-10-CM | POA: Diagnosis not present

## 2016-04-10 DIAGNOSIS — M5416 Radiculopathy, lumbar region: Secondary | ICD-10-CM

## 2016-04-10 DIAGNOSIS — M25562 Pain in left knee: Secondary | ICD-10-CM | POA: Diagnosis not present

## 2016-04-10 DIAGNOSIS — M25561 Pain in right knee: Secondary | ICD-10-CM

## 2016-04-10 DIAGNOSIS — Z5181 Encounter for therapeutic drug level monitoring: Secondary | ICD-10-CM

## 2016-04-10 DIAGNOSIS — M4180 Other forms of scoliosis, site unspecified: Secondary | ICD-10-CM

## 2016-04-10 NOTE — Patient Instructions (Addendum)
Facet Blocks Patient Information  Description: The facets are joints in the spine between the vertebrae.  Like any joints in the body, facets can become irritated and painful.  Arthritis can also effect the facets.  By injecting steroids and local anesthetic in and around these joints, we can temporarily block the nerve supply to them.  Steroids act directly on irritated nerves and tissues to reduce selling and inflammation which often leads to decreased pain.  Facet blocks may be done anywhere along the spine from the neck to the low back depending upon the location of your pain.   After numbing the skin with local anesthetic (like Novocaine), a small needle is passed onto the facet joints under x-ray guidance.  You may experience a sensation of pressure while this is being done.  The entire block usually lasts about 15-25 minutes.   Conditions which may be treated by facet blocks:   Low back/buttock pain  Neck/shoulder pain  Certain types of headaches  Preparation for the injection:  1. Do not eat any solid food or dairy products within 8 hours of your appointment. 2. You may drink clear liquid up to 3 hours before appointment.  Clear liquids include water, black coffee, juice or soda.  No milk or cream please. 3. You may take your regular medication, including pain medications, with a sip of water before your appointment.  Diabetics should hold regular insulin (if taken separately) and take 1/2 normal NPH dose the morning of the procedure.  Carry some sugar containing items with you to your appointment. 4. A driver must accompany you and be prepared to drive you home after your procedure. 5. Bring all your current medications with you. 6. An IV may be inserted and sedation may be given at the discretion of the physician. 7. A blood pressure cuff, EKG and other monitors will often be applied during the procedure.  Some patients may need to have extra oxygen administered for a short  period. 8. You will be asked to provide medical information, including your allergies and medications, prior to the procedure.  We must know immediately if you are taking blood thinners (like Coumadin/Warfarin) or if you are allergic to IV iodine contrast (dye).  We must know if you could possible be pregnant.  Possible side-effects:   Bleeding from needle site  Infection (rare, may require surgery)  Nerve injury (rare)  Numbness & tingling (temporary)  Difficulty urinating (rare, temporary)  Spinal headache (a headache worse with upright posture)  Light-headedness (temporary)  Pain at injection site (serveral days)  Decreased blood pressure (rare, temporary)  Weakness in arm/leg (temporary)  Pressure sensation in back/neck (temporary)   Call if you experience:   Fever/chills associated with headache or increased back/neck pain  Headache worsened by an upright position  New onset, weakness or numbness of an extremity below the injection site  Hives or difficulty breathing (go to the emergency room)  Inflammation or drainage at the injection site(s)  Severe back/neck pain greater than usual  New symptoms which are concerning to you  Please note:  Although the local anesthetic injected can often make your back or neck feel good for several hours after the injection, the pain will likely return. It takes 3-7 days for steroids to work.  You may not notice any pain relief for at least one week.  If effective, we will often do a series of 2-3 injections spaced 3-6 weeks apart to maximally decrease your pain.  After the initial   series, you may be a candidate for a more permanent nerve block of the facets.  If you have any questions, please call [phone] [facility]   Sacroiliac (SI) Joint Injection Patient Information  Description: The sacroiliac joint connects the scrum (very low back and tailbone) to the ilium (a pelvic bone  which also forms half of the hip joint).  Normally this joint experiences very little motion.  When this joint becomes inflamed or unstable low back and or hip and pelvis pain may result.  Injection of this joint with local anesthetics (numbing medicines) and steroids can provide diagnostic information and reduce pain.  This injection is performed with the aid of x-ray guidance into the tailbone area while you are lying on your stomach.   You may experience an electrical sensation down the leg while this is being done.  You may also experience numbness.  We also may ask if we are reproducing your normal pain during the injection.  Conditions which may be treated SI injection:   Low back, buttock, hip or leg pain  Preparation for the Injection:  1. Do not eat any solid food or dairy products within 8 hours of your appointment.  2. You may drink clear liquids up to 3 hours before appointment.  Clear liquids include water, black coffee, juice or soda.  No milk or cream please. 3. You may take your regular medications, including pain medications with a sip of water before your appointment.  Diabetics should hold regular insulin (if take separately) and take 1/2 normal NPH dose the morning of the procedure.  Carry some sugar containing items with you to your appointment. 4. A driver must accompany you and be prepared to drive you home after your procedure. 5. Bring all of your current medications with you. 6. An IV may be inserted and sedation may be given at the discretion of the physician. 7. A blood pressure cuff, EKG and other monitors will often be applied during the procedure.  Some patients may need to have extra oxygen administered for a short period.  8. You will be asked to provide medical information, including your allergies, prior to the procedure.  We must know immediately if you are taking blood thinners (like Coumadin/Warfarin) or if you are allergic to IV iodine contrast (dye).  We must  know if you could possible be pregnant.  Possible side effects:   Bleeding from needle site  Infection (rare, may require surgery)  Nerve injury (rare)  Numbness & tingling (temporary)  A brief convulsion or seizure  Light-headedness (temporary)  Pain at injection site (several days)  Decreased blood pressure (temporary)  Weakness in the leg (temporary)   Call if you experience:   New onset weakness or numbness of an extremity below the injection site that last more than 8 hours.  Hives or difficulty breathing ( go to the emergency room)  Inflammation or drainage at the injection site  Any new symptoms which are concerning to you  Please note:  Although the local anesthetic injected can often make your back/ hip/ buttock/ leg feel good for several hours after the injections, the pain will likely return.  It takes 3-7 days for steroids to work in the sacroiliac area.  You may not notice any pain relief for at least that one week.  If effective, we will often do a series of three injections spaced 3-6 weeks apart to maximally decrease your pain.  After the initial series,  we generally will wait some months before a repeat injection of the same type.  If you have any questions, please call [phone] [facility]  Follow up after psych eval., Please get lab work and x-ray done as soon as possible.

## 2016-04-10 NOTE — Progress Notes (Signed)
Patient's Name: Caitlin Reid  Patient type: New patient  MRN: [id number]  Service setting: Ambulatory outpatient  DOB: July 25, 1981  Location: [facility]  DOS: 04/10/2016  Primary Care Physician: PROVIDER NOT IN SYSTEM  Note by: Nikola Blackston A. Laban Emperor, M.D, DABA, DABAPM, DABPM, Olga Coaster, FIPP  Referring Physician: Sedonia Small, MD  Specialty: Board-Certified Interventional Pain Management     Primary Reason(s) for Visit: Initial Patient Evaluation CC: Back Pain   HPI  Caitlin Reid is a 35 y.o. year old, female patient, who comes today for an initial evaluation. She has Chronic pain; Long term current use of opiate analgesic; Long term prescription opiate use; Opiate use (22.5 MME/Day); Encounter for therapeutic drug level monitoring; Encounter for pain management planning; Chronic low back pain (Location of Primary Source of Pain) (Right); Lumbar spondylosis (L5-S1 DDD); Chronic sacroiliac joint pain (Right); Lumbar facet syndrome (Right); Chronic knee pain (Location of Tertiary source of pain) (Right); Levoscoliosis; Lumbar facet arthropathy (L3-4 and L5-S1); L5-S1 lumbar bulging disc (Right); Chronic lower extremity pain (Right); Chronic hip pain (Location of Secondary source of pain) (Right); and Chronic lumbar radicular pain (Right) on her problem list.. Her primarily concern today is the Back Pain   Pain Assessment: Self-Reported Pain Score: 9 , clinically she looks like a 3/10. Reported level of pain is not compatible with clinical observations. This symptom exaggeration may be due to malingering, an emotional response, Somatic Symptom Disorder, or a lack of understanding on how the pain scale works. Information on the pain score provided to the patient. Pain Type: Chronic pain Pain Location: Back Pain Orientation: Lower Pain Descriptors / Indicators: Aching, Burning, Stabbing, Throbbing, Tingling, Radiating, Sharp, Shooting Pain Frequency:  Constant  Onset and Duration: Gradual, Date of onset: 5 years ago and Present longer than 3 months Cause of pain: Arthritis Severity: Getting worse, NAS-11 at its worse: 10/10, NAS-11 at its best: 7/10, NAS-11 now: 10/10 and NAS-11 on the average: 9/10 Timing: Morning, Night, During activity or exercise, After activity or exercise and After a period of immobility Aggravating Factors: Bending, Climbing, Intercourse (sex), Kneeling, Lifiting, Motion, Prolonged sitting, Prolonged standing, Squatting, Stooping , Twisting, Walking, Walking uphill, Walking downhill and Working Alleviating Factors: Medications Associated Problems: Fatigue, Nausea, Numbness, Spasms, Swelling, Temperature changes, Tingling, Weakness, Pain that wakes patient up and Pain that does not allow patient to sleep Quality of Pain: Aching, Agonizing, Annoying, Burning, Deep, Dreadful, Exhausting, Getting longer, Horrible, Hot, Pressure-like, Pulsating, Sharp, Shooting, Stabbing, Tender, Throbbing, Tingling, Tiring, Toothache-like, Uncomfortable and Work related Previous Examinations or Tests: MRI scan and X-rays Previous Treatments: Narcotic medications  The patient comes into the clinics today for the first time for a chronic pain management evaluation. The patient indicates that the primary pain is in the lower back towards the right side and it radiates into the area of the hip. She consistently hip pain to be her secondary pain and again, is primarily of the right hip. Her third worst pain is that of the right knee and it is located in the anterior portion of the knee. Physical exam today was significant for pain on the right lower back on hyperextension as well as hyperextension and rotation. She also presented with pain on the right side while doing the [employer]. She indicates that in the past she has taken oxycodone 5 3 times a day which seems to work for her.   Historic Controlled Substance Pharmacotherapy Review   Previously Prescribed Opioids: Oxycodone Currently Prescribed Analgesic: Oxycodone IR 5  mg every 8 hours (15 mg/day of oxycodone) Medications: The patient did not bring the medication(s) to the appointment, as requested in our "New Patient Package" MME/day: 22.5 mg/day Pharmacodynamics: Analgesic Effect: More than 50% Activity Facilitation: Medication(s) allow patient to sit, stand, walk, and do the basic ADLs Perceived Effectiveness: Described as relatively effective, allowing for increase in activities of daily living (ADL) Side-effects or Adverse reactions: None reported Historical Background Evaluation: [place] PDMP: Five (5) year initial data search conducted. No abnormal patterns identified [place] [employer] Offender Public Information: Non-contributory Historical Hospital-associated UDS Results:  No results found for: THCU, COCAINSCRNUR, PCPSCRNUR, MDMA, AMPHETMU, METHADONE, ETOH UDS Results: No UDS results available at this time UDS Interpretation: N/A Medication Assessment Form: Not applicable. Initial evaluation. The patient has not received any medications from our practice Treatment compliance: Not applicable. Initial evaluation Risk Assessment: Aberrant Behavior: None observed or detected today Opioid Fatal Overdose Risk Factors: None identified today Non-fatal overdose hazard ratio (HR): 1.44 for 20-49 MME/day Fatal overdose hazard ratio (HR): 1.32 for 20-49 MME/day Substance Use Disorder (SUD) Risk Level: Pending results of Medical Psychology Evaluation for SUD Opioid Risk Tool (ORT) Score: Total Score: 3 Low Risk for SUD (Score <3) Depression Scale Score: PHQ-2: PHQ-2 Total Score: 0 No depression (0) PHQ-9: PHQ-9 Total Score: 0 No depression (0-4)  Pharmacologic Plan: Pending ordered tests and/or consults  Meds  The patient has a current medication list which includes the following prescription(s): alprazolam and ibuprofen.  ROS  Cardiovascular History:  Negative for hypertension, coronary artery diseas, myocardial infraction, anticoagulant therapy or heart failure Pulmonary or Respiratory History: Smoker Neurological History: Scoliosis Review of Past Neurological Studies: No results found for this or any previous visit. Psychological-Psychiatric History: Anxiety, Panic Attacks and Insomnia Gastrointestinal History: Negative for peptic ulcer disease, hiatal hernia, GERD, IBS, hepatitis, cirrhosis or pancreatitis Genitourinary History: Nephrolithiasis Hematological History: Negative for anticoagulant therapy, anemia, bruising or bleeding easily, hemophilia, sickle cell disease or trait, thrombocytopenia or coagulupathies Endocrine History: Negative for diabetes or thyroid disease Rheumatologic History: Osteoarthritis and Rheumatoid arthritis Musculoskeletal History: Negative for myasthenia gravis, muscular dystrophy, multiple sclerosis or malignant hyperthermia Work History: Quit going to work on her own.  Allergies  Ms. Badgett is allergic to doxycycline; tetracyclines & related; and other.  PFSH  Medical:  Ms. Lindo  has a past medical history of Pleurisy; Foot pain, left; Allergy; Anxiety; ADHD (attention deficit hyperactivity disorder); Insomnia; PTSD (post-traumatic stress disorder); Heel spur (left); Kidney stones; and Fracture of coccyx (HCC). Family: family history is not on file. Surgical:  has past surgical history that includes Cesarean section; Bone Spur; and Tubal ligation. Tobacco:  reports that she has been smoking Cigarettes.  She has been smoking about 0.33 packs per day. She has never used smokeless tobacco. Alcohol:  reports that she does not drink alcohol. Drug:  reports that she does not use illicit drugs. Active Ambulatory Problems    Diagnosis Date Noted  . Chronic pain 04/10/2016  . Long term current use of opiate analgesic 04/10/2016  . Long term prescription opiate use 04/10/2016  . Opiate use (22.5 MME/Day)  04/10/2016  . Encounter for therapeutic drug level monitoring 04/10/2016  . Encounter for pain management planning 04/10/2016  . Chronic low back pain (Location of Primary Source of Pain) (Right) 04/10/2016  . Lumbar spondylosis (L5-S1 DDD) 04/10/2016  . Chronic sacroiliac joint pain (Right) 04/10/2016  . Lumbar facet syndrome (Right) 04/10/2016  . Chronic knee pain (Location of Tertiary source of pain) (  Right) 04/10/2016  . Levoscoliosis 04/10/2016  . Lumbar facet arthropathy (L3-4 and L5-S1) 04/10/2016  . L5-S1 lumbar bulging disc (Right) 04/10/2016  . Chronic lower extremity pain (Right) 04/10/2016  . Chronic hip pain (Location of Secondary source of pain) (Right) 04/10/2016  . Chronic lumbar radicular pain (Right) 04/10/2016   Resolved Ambulatory Problems    Diagnosis Date Noted  . No Resolved Ambulatory Problems   Past Medical History  Diagnosis Date  . Pleurisy   . Foot pain, left   . Allergy   . Anxiety   . ADHD (attention deficit hyperactivity disorder)   . Insomnia   . PTSD (post-traumatic stress disorder)   . Heel spur left  . Kidney stones   . Fracture of coccyx (HCC)     Constitutional Exam  Vitals: Blood pressure 139/94, pulse 106, temperature 99.1 F (37.3 C), temperature source Oral, resp. rate 18, height 5\' 4"  (1.626 m), weight 160 lb (72.576 kg), last menstrual period 03/23/2016, SpO2 100 %. General appearance: Well nourished, well developed, and well hydrated. In no acute distress Calculated BMI/Body habitus: Body mass index is 27.45 kg/(m^2). (25-29.9 kg/m2) Overweight - 20% higher incidence of chronic pain Psych/Mental status: Alert and oriented x 3 (person, place, & time) Eyes: PERLA Respiratory: No evidence of acute respiratory distress  Cervical Spine Exam  Inspection: No masses, redness, or swelling Alignment: Symmetrical ROM: Functional: Adequate ROM Active: Unrestricted ROM Stability: No instability detected Muscle strength & Tone:  Functionally intact Sensory: Unimpaired Palpation: No complaints of tenderness  Upper Extremity (UE) Exam    Side: Right upper extremity  Side: Left upper extremity  Inspection: No masses, redness, swelling, or asymmetry  Inspection: No masses, redness, swelling, or asymmetry  ROM:  ROM:  Functional: Adequate ROM  Functional: Adequate ROM  Active: Unrestricted ROM  Active: Unrestricted ROM  Muscle strength & Tone: Functionally intact  Muscle strength & Tone: Functionally intact  Sensory: Unimpaired  Sensory: Unimpaired  Palpation: Non-contributory  Palpation: Non-contributory   Thoracic Spine Exam  Inspection: No masses, redness, or swelling Alignment: Symmetrical ROM: Functional: Adequate ROM Active: Unrestricted ROM Stability: No instability detected Sensory: Unimpaired Muscle strength & Tone: Functionally intact Palpation: No complaints of tenderness  Lumbar Spine Exam  Inspection: No masses, redness, or swelling Alignment: Scoliosis detected ROM: Functional: Pain-restricted ROM Active: Decreased ROM Stability: No instability detected Muscle strength & Tone: Functionally intact Sensory: Unimpaired Palpation: Tender Provocative Tests: Lumbar Hyperextension and rotation test: Positive for right sided lumbar facet pain Patrick's Maneuver: Positive for a right-sided sacroiliac joint pain  Gait & Posture Assessment  Ambulation: Unassisted Gait: Unaffected Posture: WNL  Lower Extremity Exam    Side: Right lower extremity  Side: Left lower extremity  Inspection: No masses, redness, swelling, or asymmetry ROM:  Inspection: No masses, redness, swelling, or asymmetry ROM:  Functional: Adequate ROM  Functional: Adequate ROM  Active: Unrestricted ROM  Active: Unrestricted ROM  Muscle strength & Tone: Functionally intact  Muscle strength & Tone: Functionally intact  Sensory: Unimpaired  Sensory: Unimpaired  Palpation: Non-contributory  Palpation: Non-contributory    Assessment  Primary Diagnosis & Pertinent Problem List: The primary encounter diagnosis was Chronic pain. Diagnoses of Long term current use of opiate analgesic, Long term prescription opiate use, Opiate use, Encounter for therapeutic drug level monitoring, Encounter for pain management planning, Chronic low back pain, Lumbar spondylosis, unspecified spinal osteoarthritis, Chronic sacroiliac joint pain (Right), Lumbar facet syndrome (Right), Chronic knee pain (Right), Levoscoliosis, Lumbar facet arthropathy (L3-4 and L5-S1), L5-S1 lumbar  bulging disc (Right), Chronic lower extremity pain (Right), Chronic hip pain (Location of Secondary source of pain) (Right), and Chronic lumbar radicular pain (Right) were also pertinent to this visit.  Visit Diagnosis: 1. Chronic pain   2. Long term current use of opiate analgesic   3. Long term prescription opiate use   4. Opiate use   5. Encounter for therapeutic drug level monitoring   6. Encounter for pain management planning   7. Chronic low back pain   8. Lumbar spondylosis, unspecified spinal osteoarthritis   9. Chronic sacroiliac joint pain (Right)   10. Lumbar facet syndrome (Right)   11. Chronic knee pain (Right)   12. Levoscoliosis   13. Lumbar facet arthropathy (L3-4 and L5-S1)   14. L5-S1 lumbar bulging disc (Right)   15. Chronic lower extremity pain (Right)   16. Chronic hip pain (Location of Secondary source of pain) (Right)   17. Chronic lumbar radicular pain (Right)     Assessment: No problem-specific assessment & plan notes found for this encounter.   Plan of Care  Initial Treatment Plan:  Please be advised that as per protocol, today's visit has been an evaluation only. We have not taken over the patient's controlled substance management.  Problem List Items Addressed This Visit      High   Chronic hip pain (Location of Secondary source of pain) (Right) (Chronic)   Relevant Orders   HIP INJECTION   Chronic knee pain  (Location of Tertiary source of pain) (Right) (Chronic)   Relevant Orders   DG Knee 1-2 Views Right   KNEE INJECTION   Chronic low back pain (Location of Primary Source of Pain) (Right) (Chronic)   Relevant Orders   LUMBAR FACET(MEDIAL BRANCH NERVE BLOCK) MBNB   Chronic lower extremity pain (Right) (Chronic)   Relevant Orders   LUMBAR EPIDURAL STEROID INJECTION   Chronic lumbar radicular pain (Right) (Chronic)   Relevant Orders   LUMBAR EPIDURAL STEROID INJECTION   Chronic pain - Primary (Chronic)   Relevant Orders   Comprehensive metabolic panel   C-reactive protein   Magnesium   Sedimentation rate   Vitamin B12   25-Hydroxyvitamin D Lcms D2+D3   Chronic sacroiliac joint pain (Right) (Chronic)   Relevant Orders   DG Si Joints   SACROILIAC JOINT INJECTINS   SACROILIAC JOINT INJECTINS   L5-S1 lumbar bulging disc (Right) (Chronic)   Relevant Orders   LUMBAR EPIDURAL STEROID INJECTION   Levoscoliosis (Chronic)   Lumbar facet arthropathy (L3-4 and L5-S1) (Chronic)   Relevant Orders   LUMBAR FACET(MEDIAL BRANCH NERVE BLOCK) MBNB   Lumbar facet syndrome (Right) (Chronic)   Relevant Orders   LUMBAR FACET(MEDIAL BRANCH NERVE BLOCK) MBNB   LUMBAR FACET(MEDIAL BRANCH NERVE BLOCK) MBNB   Lumbar spondylosis (L5-S1 DDD) (Chronic)     Medium   Encounter for pain management planning   Encounter for therapeutic drug level monitoring   Long term current use of opiate analgesic (Chronic)   Relevant Orders   Compliance Drug Analysis, Ur   Long term prescription opiate use (Chronic)   Opiate use (22.5 MME/Day) (Chronic)   Relevant Orders   Ambulatory referral to Psychology      Pharmacotherapy (Medications Ordered): No orders of the defined types were placed in this encounter.    Lab-work & Procedure Ordered: Orders Placed This Encounter  Procedures  . LUMBAR FACET(MEDIAL BRANCH NERVE BLOCK) MBNB  . SACROILIAC JOINT INJECTINS  . LUMBAR FACET(MEDIAL BRANCH NERVE BLOCK) MBNB   . LUMBAR  EPIDURAL STEROID INJECTION  . HIP INJECTION  . KNEE INJECTION  . SACROILIAC JOINT INJECTINS  . DG Si Joints  . DG Knee 1-2 Views Right  . Compliance Drug Analysis, Ur  . Comprehensive metabolic panel  . C-reactive protein  . Magnesium  . Sedimentation rate  . Vitamin B12  . 25-Hydroxyvitamin D Lcms D2+D3  . Ambulatory referral to Psychology    Imaging Ordered: AMB REFERRAL TO PSYCHOLOGY DG SI JOINTS DG KNEE 1-2 VIEWS RIGHT  Interventional Therapies: Scheduled:  Diagnostic right-sided sacroiliac joint injection plus  Lumbar facet block under fluoroscopic guidance and IV sedation.    Considering:   1. Lumbar facet and sacroiliac joint radiofrequency ablation.  2. Possible right-sided L4-5 lumbar epidural steroid injection under fluoroscopic guidance, without sedation.    PRN Procedures:   1. Diagnostic right-sided lumbar facet block under fluoroscopic guidance and IV sedation.  2. Diagnostic right-sided sacroiliac joint block under fluoroscopic guidance, with or without sedation.  3. Diagnostic right-sided L4-5 lumbar epidural steroid injection under fluoroscopic guidance, with or without sedation.    Referral(s) or Consult(s): Medical psychology consult for substance use disorder evaluation  Medications administered during this visit: Ms. Parga had no medications administered during this visit.  Prescriptions ordered during this visit: New Prescriptions   No medications on file    Requested PM Follow-up: Return for Return after [facility] (SUD) Eval, Procedure (Scheduled).  No future appointments.   Primary Care Physician: PROVIDER NOT IN SYSTEM Location: [facility] Note by: Dachelle Molzahn A. Laban Emperor, M.D, DABA, DABAPM, DABPM, DABIPP, FIPP  Pain Score Disclaimer: We use the NRS-11 scale. This is a self-reported, subjective measurement of pain severity with only modest accuracy. It is used primarily to identify changes within a  particular patient. It must be understood that outpatient pain scales are significantly less accurate that those used for research, where they can be applied under ideal controlled circumstances with minimal exposure to variables. In reality, the score is likely to be a combination of pain intensity and pain affect, where pain affect describes the degree of emotional arousal or changes in action readiness caused by the sensory experience of pain. Factors such as social and work situation, setting, emotional state, anxiety levels, expectation, and prior pain experience may influence pain perception and show large inter-individual differences that may also be affected by time variables.  Patient instructions provided during this appointment: Patient Instructions  Facet Blocks Patient Information  Description: The facets are joints in the spine between the vertebrae.  Like any joints in the body, facets can become irritated and painful.  Arthritis can also effect the facets.  By injecting steroids and local anesthetic in and around these joints, we can temporarily block the nerve supply to them.  Steroids act directly on irritated nerves and tissues to reduce selling and inflammation which often leads to decreased pain.  Facet blocks may be done anywhere along the spine from the neck to the low back depending upon the location of your pain.   After numbing the skin with local anesthetic (like Novocaine), a small needle is passed onto the facet joints under x-ray guidance.  You may experience a sensation of pressure while this is being done.  The entire block usually lasts about 15-25 minutes.   Conditions which may be treated by facet blocks:   Low back/buttock pain  Neck/shoulder pain  Certain types of headaches  Preparation for the injection:  1. Do not eat any solid food or dairy products  within 8 hours of your appointment. 2. You may drink clear liquid up to 3 hours before appointment.  Clear  liquids include water, black coffee, juice or soda.  No milk or cream please. 3. You may take your regular medication, including pain medications, with a sip of water before your appointment.  Diabetics should hold regular insulin (if taken separately) and take 1/2 normal NPH dose the morning of the procedure.  Carry some sugar containing items with you to your appointment. 4. A driver must accompany you and be prepared to drive you home after your procedure. 5. Bring all your current medications with you. 6. An IV may be inserted and sedation may be given at the discretion of the physician. 7. A blood pressure cuff, EKG and other monitors will often be applied during the procedure.  Some patients may need to have extra oxygen administered for a short period. 8. You will be asked to provide medical information, including your allergies and medications, prior to the procedure.  We must know immediately if you are taking blood thinners (like Coumadin/Warfarin) or if you are allergic to IV iodine contrast (dye).  We must know if you could possible be pregnant.  Possible side-effects:   Bleeding from needle site  Infection (rare, may require surgery)  Nerve injury (rare)  Numbness & tingling (temporary)  Difficulty urinating (rare, temporary)  Spinal headache (a headache worse with upright posture)  Light-headedness (temporary)  Pain at injection site (serveral days)  Decreased blood pressure (rare, temporary)  Weakness in arm/leg (temporary)  Pressure sensation in back/neck (temporary)   Call if you experience:   Fever/chills associated with headache or increased back/neck pain  Headache worsened by an upright position  New onset, weakness or numbness of an extremity below the injection site  Hives or difficulty breathing (go to the emergency room)  Inflammation or drainage at the injection site(s)  Severe back/neck pain greater than usual  New symptoms which are  concerning to you  Please note:  Although the local anesthetic injected can often make your back or neck feel good for several hours after the injection, the pain will likely return. It takes 3-7 days for steroids to work.  You may not notice any pain relief for at least one week.  If effective, we will often do a series of 2-3 injections spaced 3-6 weeks apart to maximally decrease your pain.  After the initial series, you may be a candidate for a more permanent nerve block of the facets.  If you have any questions, please call #[phone]  [facility]   Sacroiliac (SI) Joint Injection Patient Information  Description: The sacroiliac joint connects the scrum (very low back and tailbone) to the ilium (a pelvic bone which also forms half of the hip joint).  Normally this joint experiences very little motion.  When this joint becomes inflamed or unstable low back and or hip and pelvis pain may result.  Injection of this joint with local anesthetics (numbing medicines) and steroids can provide diagnostic information and reduce pain.  This injection is performed with the aid of x-ray guidance into the tailbone area while you are lying on your stomach.   You may experience an electrical sensation down the leg while this is being done.  You may also experience numbness.  We also may ask if we are reproducing your normal pain during the injection.  Conditions which may be treated SI injection:   Low back, buttock, hip  or leg pain  Preparation for the Injection:  1. Do not eat any solid food or dairy products within 8 hours of your appointment.  2. You may drink clear liquids up to 3 hours before appointment.  Clear liquids include water, black coffee, juice or soda.  No milk or cream please. 3. You may take your regular medications, including pain medications with a sip of water before your appointment.  Diabetics should hold regular insulin (if take separately)  and take 1/2 normal NPH dose the morning of the procedure.  Carry some sugar containing items with you to your appointment. 4. A driver must accompany you and be prepared to drive you home after your procedure. 5. Bring all of your current medications with you. 6. An IV may be inserted and sedation may be given at the discretion of the physician. 7. A blood pressure cuff, EKG and other monitors will often be applied during the procedure.  Some patients may need to have extra oxygen administered for a short period.  8. You will be asked to provide medical information, including your allergies, prior to the procedure.  We must know immediately if you are taking blood thinners (like Coumadin/Warfarin) or if you are allergic to IV iodine contrast (dye).  We must know if you could possible be pregnant.  Possible side effects:   Bleeding from needle site  Infection (rare, may require surgery)  Nerve injury (rare)  Numbness & tingling (temporary)  A brief convulsion or seizure  Light-headedness (temporary)  Pain at injection site (several days)  Decreased blood pressure (temporary)  Weakness in the leg (temporary)   Call if you experience:   New onset weakness or numbness of an extremity below the injection site that last more than 8 hours.  Hives or difficulty breathing ( go to the emergency room)  Inflammation or drainage at the injection site  Any new symptoms which are concerning to you  Please note:  Although the local anesthetic injected can often make your back/ hip/ buttock/ leg feel good for several hours after the injections, the pain will likely return.  It takes 3-7 days for steroids to work in the sacroiliac area.  You may not notice any pain relief for at least that one week.  If effective, we will often do a series of three injections spaced 3-6 weeks apart to maximally decrease your pain.  After the initial series, we generally will wait some months before a  repeat injection of the same type.  If you have any questions, please call [phone] [facility]  Follow up after psych eval., Please get lab work and x-ray done as soon as possible.

## 2016-04-11 ENCOUNTER — Telehealth: Payer: Self-pay | Admitting: *Deleted

## 2016-04-11 NOTE — Telephone Encounter (Signed)
Pt called stating that she go to see the doctor per Dr, Laban Emperor request on 04/16/16,. She wants to know when she could be placed on the scheduled after attending this appt.? Please call pt.

## 2016-04-12 NOTE — Telephone Encounter (Signed)
Called patient.  Husband answered her phone.  I did not leave message with him.  Instructed him to have wife call office.

## 2016-04-18 ENCOUNTER — Other Ambulatory Visit: Payer: Self-pay

## 2016-04-18 LAB — COMPLIANCE DRUG ANALYSIS, UR

## 2016-04-25 ENCOUNTER — Emergency Department
Admission: EM | Admit: 2016-04-25 | Discharge: 2016-04-25 | Disposition: A | Discharge status: Home / Self Care | Payer: Medicaid Other | Attending: Emergency Medicine | Admitting: Emergency Medicine

## 2016-04-25 ENCOUNTER — Emergency Department: Payer: Medicaid Other

## 2016-04-25 DIAGNOSIS — R109 Unspecified abdominal pain: Secondary | ICD-10-CM | POA: Diagnosis present

## 2016-04-25 DIAGNOSIS — N200 Calculus of kidney: Secondary | ICD-10-CM | POA: Diagnosis not present

## 2016-04-25 DIAGNOSIS — F17210 Nicotine dependence, cigarettes, uncomplicated: Secondary | ICD-10-CM | POA: Insufficient documentation

## 2016-04-25 DIAGNOSIS — F909 Attention-deficit hyperactivity disorder, unspecified type: Secondary | ICD-10-CM | POA: Diagnosis not present

## 2016-04-25 DIAGNOSIS — N12 Tubulo-interstitial nephritis, not specified as acute or chronic: Secondary | ICD-10-CM | POA: Diagnosis not present

## 2016-04-25 LAB — BASIC METABOLIC PANEL
Anion gap: 8 (ref 5–15)
BUN: 9 mg/dL (ref 6–20)
CHLORIDE: 104 mmol/L (ref 101–111)
CO2: 26 mmol/L (ref 22–32)
CREATININE: 0.87 mg/dL (ref 0.44–1.00)
Calcium: 9.2 mg/dL (ref 8.9–10.3)
GFR calc non Af Amer: >60 mL/min (ref >60)
Glucose, Bld: 93 mg/dL (ref 65–99)
POTASSIUM: 3.5 mmol/L (ref 3.5–5.1)
SODIUM: 138 mmol/L (ref 135–145)

## 2016-04-25 LAB — CBC WITH DIFFERENTIAL/PLATELET
Basophils Absolute: 0.1 10*3/uL (ref 0–0.1)
Basophils Relative: 1 %
Eosinophils Absolute: 0.1 10*3/uL (ref 0–0.7)
Eosinophils Relative: 1 %
HCT: 43 % (ref 35.0–47.0)
HEMOGLOBIN: 14.3 g/dL (ref 12.0–16.0)
LYMPHS ABS: 3.5 10*3/uL (ref 1.0–3.6)
MCH: 28.5 pg (ref 26.0–34.0)
MCHC: 33.3 g/dL (ref 32.0–36.0)
MCV: 85.6 fL (ref 80.0–100.0)
Monocytes Absolute: 0.8 10*3/uL (ref 0.2–0.9)
Neutro Abs: 12.7 10*3/uL — ABNORMAL HIGH (ref 1.4–6.5)
Platelets: 321 10*3/uL (ref 150–440)
RBC: 5.02 MIL/uL (ref 3.80–5.20)
RDW: 14.4 % (ref 11.5–14.5)
WBC: 17.3 10*3/uL — AB (ref 3.6–11.0)

## 2016-04-25 LAB — URINALYSIS COMPLETE WITH MICROSCOPIC (ARMC ONLY)
BILIRUBIN URINE: NEGATIVE
Bacteria, UA: NONE SEEN
GLUCOSE, UA: NEGATIVE mg/dL
NITRITE: NEGATIVE
Protein, ur: 30 mg/dL — AB
Specific Gravity, Urine: 1.017 (ref 1.005–1.030)
pH: 7 (ref 5.0–8.0)

## 2016-04-25 LAB — PREGNANCY, URINE: Preg Test, Ur: NEGATIVE

## 2016-04-25 MED ORDER — MORPHINE SULFATE (PF) 2 MG/ML IV SOLN
1.0000 mg | Freq: Once | INTRAVENOUS | Status: AC
  Administered 2016-04-25: 1 mg via INTRAVENOUS
  Filled 2016-04-25: qty 1

## 2016-04-25 MED ORDER — DEXTROSE 5 % IV SOLN
1.0000 g | Freq: Once | INTRAVENOUS | Status: AC
  Administered 2016-04-25: 1 g via INTRAVENOUS
  Filled 2016-04-25: qty 10

## 2016-04-25 MED ORDER — ONDANSETRON 4 MG PO TBDP
4.0000 mg | ORAL_TABLET | Freq: Once | ORAL | Status: AC
  Administered 2016-04-25: 4 mg via ORAL
  Filled 2016-04-25: qty 1

## 2016-04-25 MED ORDER — ONDANSETRON HCL 4 MG/2ML IJ SOLN
INTRAMUSCULAR | Status: AC
  Administered 2016-04-25: 4 mg via INTRAVENOUS
  Filled 2016-04-25: qty 2

## 2016-04-25 MED ORDER — SODIUM CHLORIDE 0.9 % IV BOLUS (SEPSIS)
1000.0000 mL | Freq: Once | INTRAVENOUS | Status: AC
  Administered 2016-04-25: 1000 mL via INTRAVENOUS

## 2016-04-25 MED ORDER — CEPHALEXIN 500 MG PO CAPS: 500.0000 mg | ORAL_CAPSULE | Freq: Three times a day (TID) | ORAL | Status: DC

## 2016-04-25 MED ORDER — HYDROCODONE-ACETAMINOPHEN 5-325 MG PO TABS: 1.0000 | ORAL_TABLET | Freq: Four times a day (QID) | ORAL | Status: DC | PRN

## 2016-04-25 MED ORDER — ONDANSETRON HCL 4 MG/2ML IJ SOLN
4.0000 mg | Freq: Once | INTRAMUSCULAR | Status: AC
  Administered 2016-04-25: 4 mg via INTRAVENOUS

## 2016-04-25 MED ORDER — HYDROCODONE-ACETAMINOPHEN 5-325 MG PO TABS
1.0000 | ORAL_TABLET | Freq: Once | ORAL | Status: AC
  Administered 2016-04-25: 1 via ORAL
  Filled 2016-04-25: qty 1

## 2016-04-25 NOTE — ED Provider Notes (Signed)
[facility] Emergency Department Provider Note  ____________________________________________  Time seen: Approximately 8:44 PM  I have reviewed the triage vital signs and the nursing notes.   HISTORY  Chief Complaint Flank Pain    HPI Caitlin Reid is a 35 y.o. female , NAD, presents to the emergency room with three-day history of increased urinary frequency, lower bladder pressure and flank pain that has been worsening. States she has a history of kidney infections and kidney stones. Last one was a few months ago. States she has had symptoms over the last 3 days that are significantly worsening. Has had lower back pain with the left being worse than the right. Also notes some nausea but no vomiting. Denies any fever, chills, body aches. Has not noted any hematuria nor does she complain of dysuria. Denies any injury or trauma.    Past Medical History  Diagnosis Date  . Pleurisy     Patient reports that she has had this twice  . Foot pain, left   . Allergy   . Anxiety   . ADHD (attention deficit hyperactivity disorder)   . Insomnia   . PTSD (post-traumatic stress disorder)   . Heel spur left  . Kidney stones   . Fracture of coccyx [facility])     Patient Active Problem List   Diagnosis Date Noted  . Chronic pain 04/10/2016  . Long term current use of opiate analgesic 04/10/2016  . Long term prescription opiate use 04/10/2016  . Opiate use (22.5 MME/Day) 04/10/2016  . Encounter for therapeutic drug level monitoring 04/10/2016  . Encounter for pain management planning 04/10/2016  . Chronic low back pain (Location of Primary Source of Pain) (Right) 04/10/2016  . Lumbar spondylosis (L5-S1 DDD) 04/10/2016  . Chronic sacroiliac joint pain (Right) 04/10/2016  . Lumbar facet syndrome (Right) 04/10/2016  . Chronic knee pain (Location of Tertiary source of pain) (Right) 04/10/2016  . Levoscoliosis 04/10/2016  . Lumbar facet arthropathy (L3-4 and L5-S1)  04/10/2016  . L5-S1 lumbar bulging disc (Right) 04/10/2016  . Chronic lower extremity pain (Right) 04/10/2016  . Chronic hip pain (Location of Secondary source of pain) (Right) 04/10/2016  . Chronic lumbar radicular pain (Right) 04/10/2016    Past Surgical History  Procedure Laterality Date  . Cesarean section    . Bone spur    . Tubal ligation      Current Outpatient Rx  Name  Route  Sig  Dispense  Refill  . ALPRAZolam (XANAX) 1 MG tablet   Oral   Take 1 mg by mouth 3 (three) times daily as needed for anxiety.         . cephALEXin (KEFLEX) 500 MG capsule   Oral   Take 1 capsule (500 mg total) by mouth 3 (three) times daily.   30 capsule   0   . HYDROcodone-acetaminophen (NORCO) 5-325 MG tablet   Oral   Take 1-2 tablets by mouth every 6 (six) hours as needed for severe pain.   12 tablet   0   . ibuprofen (ADVIL,MOTRIN) 800 MG tablet   Oral   Take 1 tablet (800 mg total) by mouth every 8 (eight) hours as needed.   30 tablet   0     Allergies Doxycycline; Tetracyclines & related; and Other  No family history on file.  Social History Social History  Substance Use Topics  . Smoking status: Current Every Day Smoker -- 0.33 packs/day    Types: Cigarettes  . Smokeless tobacco:  Never Used  . Alcohol Use: No     Review of Systems  Constitutional: No fever/chills Eyes: No visual changes. Cardiovascular: No chest pain. Respiratory:  No shortness of breath. No wheezing.  Gastrointestinal: Positive abdominal pain, nausea.  No vomiting.  No diarrhea.  No constipation. Genitourinary: Positive increased urinary frequency with urgency. Negative for dysuria. No hematuria. No urinary hesitancy Musculoskeletal: Positive for back pain.  Skin: Negative for rash. Neurological: Negative for headaches, focal weakness or numbness. No saddle paresthesias, tingling. 10-point ROS otherwise negative.  ____________________________________________   PHYSICAL EXAM:  VITAL  SIGNS: ED Triage Vitals  Enc Vitals Group     BP 04/25/16 1849 150/92 mmHg     Pulse Rate 04/25/16 1849 89     Resp 04/25/16 1849 20     Temp 04/25/16 1849 98.2 F (36.8 C)     Temp Source 04/25/16 1849 Oral     SpO2 04/25/16 1849 100 %     Weight 04/25/16 1849 150 lb (68.04 kg)     Height 04/25/16 1849 5\' 3"  (1.6 m)     Head Cir --      Peak Flow --      Pain Score 04/25/16 1849 10     Pain Loc --      Pain Edu? --      Excl. in GC? --      Constitutional: Alert and oriented. Well appearing and in no acute distress. Eyes: Conjunctivae are normal.  Head: Atraumatic. Cardiovascular: Normal rate, regular rhythm. Normal S1 and S2.  Good peripheral circulation with 2+ pulses noted in the lower extremities. Respiratory: Normal respiratory effort without tachypnea or retractions. Lungs CTAB with breath sounds noted in all lung fields. Gastrointestinal: Tender to palpation about the suprapubic region but without guarding or distention. All other quadrants are soft and nontender without distention or guarding. Bowel sounds are grossly normal. Positive bilateral CVA tenderness. Musculoskeletal: No lower extremity tenderness nor edema.  No joint effusions. Neurologic:  Normal speech and language. No gross focal neurologic deficits are appreciated.  Skin:  Skin is warm, dry and intact. No rash noted. Psychiatric: Mood and affect are normal. Speech and behavior are normal. Patient exhibits appropriate insight and judgement.   ____________________________________________   LABS (all labs ordered are listed, but only abnormal results are displayed)  Labs Reviewed  URINALYSIS COMPLETEWITH MICROSCOPIC ([facility] ONLY) - Abnormal; Notable for the following:    Color, Urine YELLOW (*)    APPearance CLOUDY (*)    Ketones, ur TRACE (*)    Hgb urine dipstick 1+ (*)    Protein, ur 30 (*)    Leukocytes, UA 3+ (*)    Squamous Epithelial / LPF 0-5 (*)    All other components within normal limits   CBC WITH DIFFERENTIAL/PLATELET - Abnormal; Notable for the following:    WBC 17.3 (*)    Neutro Abs 12.7 (*)    All other components within normal limits  URINE CULTURE  PREGNANCY, URINE  BASIC METABOLIC PANEL   ____________________________________________  EKG  None ____________________________________________  RADIOLOGY I have personally viewed and evaluated these images (plain radiographs) as part of my medical decision making, as well as reviewing the written report by the radiologist.  Ct Renal Stone Study  04/25/2016  CLINICAL DATA:  35 year old female history of kidney stones presenting with flank pain and dysuria. EXAM: CT ABDOMEN AND PELVIS WITHOUT CONTRAST TECHNIQUE: Multidetector CT imaging of the abdomen and pelvis was performed following the standard protocol without IV  contrast. COMPARISON:  CT of the abdomen pelvis dated 12/07/2015 FINDINGS: Evaluation of this exam is limited in the absence of intravenous contrast. The visualized lung bases are clear. No intra-abdominal free air or free fluid. Diffuse fatty infiltration of the liver. Layering sludge or hyper concentrated bowel noted within the gallbladder. No calcified stone. No pericholecystic fluid. The pancreas, spleen, adrenal glands appear unremarkable. There is a 2 mm nonobstructing left renal inferior pole calculus. No hydronephrosis. The right kidney is unremarkable. The visualized ureters, and urinary bladder appear unremarkable. The uterus anteverted and grossly unremarkable. Bilateral tubal occlusive device is noted. There is no evidence of bowel obstruction or active inflammation. Normal appendix. The abdominal aorta and IVC appear unremarkable on this noncontrast study. No portal venous gas identified. There is no adenopathy. There is a small fat containing umbilical hernia. The abdominal wall soft tissues are otherwise unremarkable. The osseous structures are intact. IMPRESSION: A 2 mm nonobstructing left renal  inferior pole calculus. No hydronephrosis. No other acute intra-abdominal or pelvic pathology identified. Electronically Signed   By: Elgie Collard M.D.   On: 04/25/2016 22:20    ____________________________________________    PROCEDURES  Procedure(s) performed: None    Medications  HYDROcodone-acetaminophen (NORCO/VICODIN) 5-325 MG per tablet 1 tablet (1 tablet Oral Given 04/25/16 2058)  ondansetron (ZOFRAN-ODT) disintegrating tablet 4 mg (4 mg Oral Given 04/25/16 2058)  cefTRIAXone (ROCEPHIN) 1 g in dextrose 5 % 50 mL IVPB (0 g Intravenous Stopped 04/25/16 2219)  sodium chloride 0.9 % bolus 1,000 mL (0 mLs Intravenous Stopped 04/25/16 2341)  ondansetron (ZOFRAN) injection 4 mg (4 mg Intravenous Given 04/25/16 2200)  morphine 2 MG/ML injection 1 mg (1 mg Intravenous Given 04/25/16 2200)   ----------------------------------------- 11:34 PM on 04/25/2016 -----------------------------------------  Patient has tolerated all medications while in the emergency department. She reports significantly decreased pain as well as decreased urinary frequency at this time.  ____________________________________________   INITIAL IMPRESSION / ASSESSMENT AND PLAN / ED COURSE  Pertinent labs & imaging results that were available during my care of the patient were reviewed by me and considered in my medical decision making (see chart for details).  I spoke with Dr. Loreli Dollar in regards to the patient's history, physical exam, lab and imaging results and he suggests discussing the patient's case with urologist on call. Plan at this time will be to discharge patient on Keflex and complete urine culture. Patient should have follow-up with her primary care provider in urology in the next week.  I consulted with Dr. Sherryl Barters in urology in regards to the patient's history, physical exam and lab and imaging results. Dr. Sherryl Barters has no concerns in regards to the results of the CT as there is a nonobstructive 2 mm  stone that is not causing any hydronephrosis. He is happy see the patient in follow-up in his office for other urologic complaints but as of this time no invasive procedures are necessary.  Patient's diagnosis is consistent with nephrolithiasis and pyelonephritis. Patient will be discharged home with prescriptions for Keflex and Norco to take as directed. Patient is to follow up with urology and her primary care provider as instructed above. The patient is unable to see her primary care provider for follow straight return to this emergency department for recheck. Patient is given strict ED precautions to return to the ED for any worsening or new symptoms.      ____________________________________________  FINAL CLINICAL IMPRESSION(S) / ED DIAGNOSES  Final diagnoses:  Nephrolithiasis  Pyelonephritis  NEW MEDICATIONS STARTED DURING THIS VISIT:  Discharge Medication List as of 04/25/2016 11:39 PM    START taking these medications   Details  cephALEXin (KEFLEX) 500 MG capsule Take 1 capsule (500 mg total) by mouth 3 (three) times daily., Starting 04/25/2016, Until Discontinued, Print    HYDROcodone-acetaminophen (NORCO) 5-325 MG tablet Take 1-2 tablets by mouth every 6 (six) hours as needed for severe pain., Starting 04/25/2016, Until Discontinued, Print             Hope Pigeon, PA-C 04/25/16 2353  Myrna Blazer, MD 04/25/16 [phone]

## 2016-04-25 NOTE — Discharge Instructions (Signed)
Kidney Stones Kidney stones (urolithiasis) are deposits that form inside your kidneys. The intense pain is caused by the stone moving through the urinary tract. When the stone moves, the ureter goes into spasm around the stone. The stone is usually passed in the urine.  CAUSES   A disorder that makes certain neck glands produce too much parathyroid hormone (primary hyperparathyroidism).  A buildup of uric acid crystals, similar to gout in your joints.  Narrowing (stricture) of the ureter.  A kidney obstruction present at birth (congenital obstruction).  Previous surgery on the kidney or ureters.  Numerous kidney infections. SYMPTOMS   Feeling sick to your stomach (nauseous).  Throwing up (vomiting).  Blood in the urine (hematuria).  Pain that usually spreads (radiates) to the groin.  Frequency or urgency of urination. DIAGNOSIS   Taking a history and physical exam.  Blood or urine tests.  CT scan.  Occasionally, an examination of the inside of the urinary bladder (cystoscopy) is performed. TREATMENT   Observation.  Increasing your fluid intake.  Extracorporeal shock wave lithotripsy--This is a noninvasive procedure that uses shock waves to break up kidney stones.  Surgery may be needed if you have severe pain or persistent obstruction. There are various surgical procedures. Most of the procedures are performed with the use of small instruments. Only small incisions are needed to accommodate these instruments, so recovery time is minimized. The size, location, and chemical composition are all important variables that will determine the proper choice of action for you. Talk to your health care provider to better understand your situation so that you will minimize the risk of injury to yourself and your kidney.  HOME CARE INSTRUCTIONS   Drink enough water and fluids to keep your urine clear or pale yellow. This will help you to pass the stone or stone fragments.  Strain  all urine through the provided strainer. Keep all particulate matter and stones for your health care provider to see. The stone causing the pain may be as small as a grain of salt. It is very important to use the strainer each and every time you pass your urine. The collection of your stone will allow your health care provider to analyze it and verify that a stone has actually passed. The stone analysis will often identify what you can do to reduce the incidence of recurrences.  Only take over-the-counter or prescription medicines for pain, discomfort, or fever as directed by your health care provider.  Keep all follow-up visits as told by your health care provider. This is important.  Get follow-up X-rays if required. The absence of pain does not always mean that the stone has passed. It may have only stopped moving. If the urine remains completely obstructed, it can cause loss of kidney function or even complete destruction of the kidney. It is your responsibility to make sure X-rays and follow-ups are completed. Ultrasounds of the kidney can show blockages and the status of the kidney. Ultrasounds are not associated with any radiation and can be performed easily in a matter of minutes.  Make changes to your daily diet as told by your health care provider. You may be told to:  Limit the amount of salt that you eat.  Eat 5 or more servings of fruits and vegetables each day.  Limit the amount of meat, poultry, fish, and eggs that you eat.  Collect a 24-hour urine sample as told by your health care provider.You may need to collect another urine sample every 6-12  months. SEEK MEDICAL CARE IF:  You experience pain that is progressive and unresponsive to any pain medicine you have been prescribed. SEEK IMMEDIATE MEDICAL CARE IF:   Pain cannot be controlled with the prescribed medicine.  You have a fever or shaking chills.  The severity or intensity of pain increases over 18 hours and is not  relieved by pain medicine.  You develop a new onset of abdominal pain.  You feel faint or pass out.  You are unable to urinate.   This information is not intended to replace advice given to you by your health care provider. Make sure you discuss any questions you have with your health care provider.   Document Released: 11/04/2005 Document Revised: 07/26/2015 Document Reviewed: 04/07/2013 Elsevier Interactive Patient Education 2016 [employer]  Pyelonephritis, Adult Pyelonephritis is a kidney infection. The kidneys are the organs that filter a person's blood and move waste out of the bloodstream and into the urine. Urine passes from the kidneys, through the ureters, and into the bladder. There are two main types of pyelonephritis:  Infections that come on quickly without any warning (acute pyelonephritis).  Infections that last for a long period of time (chronic pyelonephritis). In most cases, the infection clears up with treatment and does not cause further problems. More severe infections or chronic infections can sometimes spread to the bloodstream or lead to other problems with the kidneys. CAUSES This condition is usually caused by:  Bacteria traveling from the bladder to the kidney through infected urine. The urine in the bladder can become infected with bacteria from:  Bladder infection (cystitis).  Inflammation of the prostate gland (prostatitis).  Sexual intercourse, in females.  Bacteria traveling from the bloodstream to the kidney. RISK FACTORS This condition is more likely to develop in:  Pregnant women.  Older people.  People who have diabetes.  People who have kidney stones or bladder stones.  People who have other abnormalities of the kidney or ureter.  People who have a catheter placed in the bladder.  People who have cancer.  People who are sexually active.  Women who use spermicides.  People who have had a prior urinary tract  infection. SYMPTOMS Symptoms of this condition include:  Frequent urination.  Strong or persistent urge to urinate.  Burning or stinging when urinating.  Abdominal pain.  Back pain.  Pain in the side or flank area.  Fever.  Chills.  Blood in the urine, or dark urine.  Nausea.  Vomiting. DIAGNOSIS This condition may be diagnosed based on:  Medical history and physical exam.  Urine tests.  Blood tests. You may also have imaging tests of the kidneys, such as an ultrasound or CT scan. TREATMENT Treatment for this condition may depend on the severity of the infection.  If the infection is mild and is found early, you may be treated with antibiotic medicines taken by mouth. You will need to drink fluids to remain hydrated.  If the infection is more severe, you may need to stay in the hospital and receive antibiotics given directly into a vein through an IV tube. You may also need to receive fluids through an IV tube if you are not able to remain hydrated. After your hospital stay, you may need to take oral antibiotics for a period of time. Other treatments may be required, depending on the cause of the infection. HOME CARE INSTRUCTIONS Medicines  Take over-the-counter and prescription medicines only as told by your health care provider.  If you were  prescribed an antibiotic medicine, take it as told by your health care provider. Do not stop taking the antibiotic even if you start to feel better. General Instructions  Drink enough fluid to keep your urine clear or pale yellow.  Avoid caffeine, tea, and carbonated beverages. They tend to irritate the bladder.  Urinate often. Avoid holding in urine for long periods of time.  Urinate before and after sex.  After a bowel movement, women should cleanse from front to back. Use each tissue only once.  Keep all follow-up visits as told by your health care provider. This is important. SEEK MEDICAL CARE IF:  Your symptoms  do not get better after 2 days of treatment.  Your symptoms get worse.  You have a fever. SEEK IMMEDIATE MEDICAL CARE IF:  You are unable to take your antibiotics or fluids.  You have shaking chills.  You vomit.  You have severe flank or back pain.  You have extreme weakness or fainting.   This information is not intended to replace advice given to you by your health care provider. Make sure you discuss any questions you have with your health care provider.   Document Released: 11/04/2005 Document Revised: 07/26/2015 Document Reviewed: 02/27/2015 Elsevier Interactive Patient Education [employer]

## 2016-04-25 NOTE — ED Notes (Signed)
Pt here with c/o right flank pain x3 days, reports bladder pressure and urinary frequency.

## 2016-04-25 NOTE — ED Notes (Addendum)
Pt. Reports urinary frequency and constant bladder and flank pressure 9/10n on a pain assessment that is unrelieved by position changes and denies home tx. Pt. Denies hematuria. Pt. Reports N/D, denies vomiting.

## 2016-04-28 LAB — URINE CULTURE: Culture: 50000 — AB

## 2016-05-09 ENCOUNTER — Telehealth: Payer: Self-pay | Admitting: *Deleted

## 2016-05-09 NOTE — Telephone Encounter (Signed)
Attempted to call patient, message left. 

## 2016-05-09 NOTE — Telephone Encounter (Signed)
Pt would like to speak with a nurse about her care plan. She wants to know is it ok the schedule with Dr. Laban Emperor? Please give her a call...thanks

## 2016-05-10 NOTE — Telephone Encounter (Signed)
Has not seen med/psychologist because has to pay private pay. Asking if she can still see Dr. Laban Emperor at a later time after she sees med/psych. Patient informed that is ok, just call when she is ready to make an appt, after she sees med/psych.

## 2016-05-25 ENCOUNTER — Encounter: Payer: Self-pay | Admitting: Emergency Medicine

## 2016-05-25 ENCOUNTER — Emergency Department: Payer: Medicaid Other

## 2016-05-25 ENCOUNTER — Emergency Department
Admission: EM | Admit: 2016-05-25 | Discharge: 2016-05-25 | Disposition: A | Discharge status: Home / Self Care | Payer: Medicaid Other | Attending: Emergency Medicine | Admitting: Emergency Medicine

## 2016-05-25 DIAGNOSIS — S025XXB Fracture of tooth (traumatic), initial encounter for open fracture: Secondary | ICD-10-CM

## 2016-05-25 DIAGNOSIS — Z791 Long term (current) use of non-steroidal anti-inflammatories (NSAID): Secondary | ICD-10-CM | POA: Diagnosis not present

## 2016-05-25 DIAGNOSIS — F909 Attention-deficit hyperactivity disorder, unspecified type: Secondary | ICD-10-CM | POA: Insufficient documentation

## 2016-05-25 DIAGNOSIS — Z79899 Other long term (current) drug therapy: Secondary | ICD-10-CM | POA: Diagnosis not present

## 2016-05-25 DIAGNOSIS — Y999 Unspecified external cause status: Secondary | ICD-10-CM | POA: Diagnosis not present

## 2016-05-25 DIAGNOSIS — S0993XA Unspecified injury of face, initial encounter: Secondary | ICD-10-CM | POA: Diagnosis present

## 2016-05-25 DIAGNOSIS — F17210 Nicotine dependence, cigarettes, uncomplicated: Secondary | ICD-10-CM | POA: Diagnosis not present

## 2016-05-25 DIAGNOSIS — Y9389 Activity, other specified: Secondary | ICD-10-CM | POA: Insufficient documentation

## 2016-05-25 DIAGNOSIS — K122 Cellulitis and abscess of mouth: Secondary | ICD-10-CM | POA: Diagnosis not present

## 2016-05-25 DIAGNOSIS — Y929 Unspecified place or not applicable: Secondary | ICD-10-CM | POA: Diagnosis not present

## 2016-05-25 DIAGNOSIS — S93402A Sprain of unspecified ligament of left ankle, initial encounter: Secondary | ICD-10-CM | POA: Diagnosis not present

## 2016-05-25 DIAGNOSIS — X509XXA Other and unspecified overexertion or strenuous movements or postures, initial encounter: Secondary | ICD-10-CM | POA: Diagnosis not present

## 2016-05-25 DIAGNOSIS — Z792 Long term (current) use of antibiotics: Secondary | ICD-10-CM | POA: Insufficient documentation

## 2016-05-25 MED ORDER — FLUTICASONE PROPIONATE 50 MCG/ACT NA SUSP: 2.0000 | Freq: Every day | NASAL | Status: DC

## 2016-05-25 MED ORDER — HYDROCODONE-ACETAMINOPHEN 5-325 MG PO TABS: 1.0000 | ORAL_TABLET | Freq: Four times a day (QID) | ORAL | Status: DC | PRN

## 2016-05-25 MED ORDER — AMOXICILLIN 500 MG PO TABS: 500.0000 mg | ORAL_TABLET | Freq: Three times a day (TID) | ORAL | Status: DC

## 2016-05-25 MED ORDER — KETOROLAC TROMETHAMINE 30 MG/ML IJ SOLN
30.0000 mg | Freq: Once | INTRAMUSCULAR | Status: AC
  Administered 2016-05-25: 30 mg via INTRAMUSCULAR
  Filled 2016-05-25: qty 1

## 2016-05-25 NOTE — ED Provider Notes (Signed)
[facility] Emergency Department Provider Note  ____________________________________________  Time seen: Approximately 4:52 PM  I have reviewed the triage vital signs and the nursing notes.   HISTORY  Chief Complaint Dental Pain and Ankle Pain    HPI Caitlin Reid is a 35 y.o. female , NAD, presents to emergency part with 3 day history of left lower tooth pain. Patient states she woke 3 days ago with left, lower, posterior tooth pain. She checked her mouth and noted that she saw the tooth was broken. Does not remember any specific injury or trauma to cause a broken tooth. Does have an appointment to see her personal dentist within the week. Has been taking over-the-counter ibuprofen without relief of pain. Notes that the pain radiates into the left jaw and ear. Denies any discharge from the ear. Has not noted any swelling about her jaw. States the pain has been so severe she is a been unable to sleep. Also notes that she recently tripped while getting out of her vehicle and twisted her left ankle. Has had bruising and swelling to the ankle since that time. Denies any open wounds or lacerations. Pain increases with weightbearing. Denies numbness, weakness, tingling.   Patient also relays that she has been referred to pain management for her chronic lower back pain but has been unable to establish a she has been unable to afford the psychiatric evaluation that is necessary for admittance. States she has been unable to find an office that will complete the evaluation and bill her Medicaid insurance.   Past Medical History  Diagnosis Date  . Pleurisy     Patient reports that she has had this twice  . Foot pain, left   . Allergy   . Anxiety   . ADHD (attention deficit hyperactivity disorder)   . Insomnia   . PTSD (post-traumatic stress disorder)   . Heel spur left  . Kidney stones   . Fracture of coccyx [facility])     Patient Active Problem List   Diagnosis  Date Noted  . Chronic pain 04/10/2016  . Long term current use of opiate analgesic 04/10/2016  . Long term prescription opiate use 04/10/2016  . Opiate use (22.5 MME/Day) 04/10/2016  . Encounter for therapeutic drug level monitoring 04/10/2016  . Encounter for pain management planning 04/10/2016  . Chronic low back pain (Location of Primary Source of Pain) (Right) 04/10/2016  . Lumbar spondylosis (L5-S1 DDD) 04/10/2016  . Chronic sacroiliac joint pain (Right) 04/10/2016  . Lumbar facet syndrome (Right) 04/10/2016  . Chronic knee pain (Location of Tertiary source of pain) (Right) 04/10/2016  . Levoscoliosis 04/10/2016  . Lumbar facet arthropathy (L3-4 and L5-S1) 04/10/2016  . L5-S1 lumbar bulging disc (Right) 04/10/2016  . Chronic lower extremity pain (Right) 04/10/2016  . Chronic hip pain (Location of Secondary source of pain) (Right) 04/10/2016  . Chronic lumbar radicular pain (Right) 04/10/2016    Past Surgical History  Procedure Laterality Date  . Cesarean section    . Bone spur    . Tubal ligation      Current Outpatient Rx  Name  Route  Sig  Dispense  Refill  . ALPRAZolam (XANAX) 1 MG tablet   Oral   Take 1 mg by mouth 3 (three) times daily as needed for anxiety.         Marland Kitchen amoxicillin (AMOXIL) 500 MG tablet   Oral   Take 1 tablet (500 mg total) by mouth 3 (three) times daily with meals.  30 tablet   0   . cephALEXin (KEFLEX) 500 MG capsule   Oral   Take 1 capsule (500 mg total) by mouth 3 (three) times daily.   30 capsule   0   . fluticasone (FLONASE) 50 MCG/ACT nasal spray   Each Nare   Place 2 sprays into both nostrils daily.   16 g   0   . HYDROcodone-acetaminophen (NORCO) 5-325 MG tablet   Oral   Take 1 tablet by mouth every 6 (six) hours as needed for severe pain.   12 tablet   0   . ibuprofen (ADVIL,MOTRIN) 800 MG tablet   Oral   Take 1 tablet (800 mg total) by mouth every 8 (eight) hours as needed.   30 tablet   0      Allergies Doxycycline; Tetracyclines & related; and Other  History reviewed. No pertinent family history.  Social History Social History  Substance Use Topics  . Smoking status: Current Every Day Smoker -- 0.33 packs/day    Types: Cigarettes  . Smokeless tobacco: Never Used  . Alcohol Use: No     Review of Systems  Constitutional: No fever/chills Eyes: No visual changes. No discharge ENT: Positive left lower tooth pain, left ear pain. No sore throat, sinus pressure. Cardiovascular: No chest pain. Respiratory: No shortness of breath. No wheezing.  Gastrointestinal: No abdominal pain.  No nausea, vomiting.   Musculoskeletal: Positive left ankle pain and swelling. Negative for back pain.  Skin: Positive bruising left ankle. Negative for rash, redness, warmth, open wounds or lacerations. Neurological: Negative for headaches, focal weakness or numbness. No tingling 10-point ROS otherwise negative.  ____________________________________________   PHYSICAL EXAM:  VITAL SIGNS: ED Triage Vitals  Enc Vitals Group     BP --      Pulse --      Resp --      Temp --      Temp src --      SpO2 --      Weight --      Height --      Head Cir --      Peak Flow --      Pain Score --      Pain Loc --      Pain Edu? --      Excl. in GC? --      Constitutional: Alert and oriented. Well appearing and in no acute distress. Eyes: Conjunctivae are normal.  Head: Atraumatic. ENT:      Ears: Right TM visualized without abnormality. Left TM visualized with trace serous effusion but no erythema, bulging, perforation. Bilateral external ear canals without swelling, erythema, discharge.      Nose: No congestion/rhinnorhea.      Mouth/Throat: Left, lower posterior molar with one third of the medial edge broken off with sharp edges. Left lower posterior. Her gumline with tenderness to palpation and mild erythema. Mucous membranes are moist. Nares without erythema, swelling,  exudate Neck: Supple with FROM Hematological/Lymphatic/Immunilogical: No cervical lymphadenopathy. Cardiovascular:  Good peripheral circulation with 2+ pulses noted in the upper and lower extremities. Respiratory: Normal respiratory effort without tachypnea or retractions.  Musculoskeletal: Tenderness to palpation about the medial, anterior and lateral ankle. Swelling noted about the lateral malleolus with bruising. Full range of motion of the ankle but pain with full flexion. No anterior posterior drawer laxity. No varus or valgus stress laxity. No lower extremity tenderness nor edema.  No joint effusions. Neurologic:  Normal speech and language.  No gross focal neurologic deficits are appreciated. Sensation to light touch grossly intact about the left lower extremity Skin:  Skin is warm, dry and intact. No rash, redness, open wounds or lacerations noted. Psychiatric: Mood and affect are normal. Speech and behavior are normal. Patient exhibits appropriate insight and judgement.   ____________________________________________   LABS  None ____________________________________________  EKG  None ____________________________________________  RADIOLOGY I have personally viewed and evaluated these images (plain radiographs) as part of my medical decision making, as well as reviewing the written report by the radiologist.  Dg Ankle Complete Left  05/25/2016  CLINICAL DATA:  Fall, twisted left ankle EXAM: LEFT ANKLE COMPLETE - 3+ VIEW COMPARISON:  None. FINDINGS: No fracture or dislocation is seen. Well corticated osseous density overlying the medial malleolus, not acute. Mild diffuse soft tissue swelling. The ankle mortise is intact. The base of the fifth metatarsal is unremarkable. Small plantar calcaneal enthesophyte. IMPRESSION: No fracture or dislocation is seen. Mild diffuse soft tissue swelling. Electronically Signed   By: Charline Bills M.D.   On: 05/25/2016 17:36     ____________________________________________    PROCEDURES  Procedure(s) performed: None    Medications  ketorolac (TORADOL) 30 MG/ML injection 30 mg (not administered)     ____________________________________________   INITIAL IMPRESSION / ASSESSMENT AND PLAN / ED COURSE  Pertinent imaging results that were available during my care of the patient were reviewed by me and considered in my medical decision making (see chart for details).  Patient's diagnosis is consistent with Cellulitis of oral soft tissues, broken tooth and left ankle sprain. Patient will be discharged home with prescriptions for amoxicillin, Flonase and Norco to take as directed. Patient's left ankle was placed in a Ace wrap for comfort care. Patient has crutches that she may use at home. Should apply ice to the left ankle 20 minutes 3-4 times daily and keep elevated when not ambulating. Patient is to follow up with her dentist as currently scheduled later next week. Patient may also follow-up with her primary care provider or Dr. [facility]ac[facility] orth[facility] if symptoms of her left ankle sprain persist past this treatment course. Patient is given ED precautions to return to the ED for any worsening or new symptoms.    __________________________________________  FINAL CLINICAL IMPRESSION(S) / ED DIAGNOSES  Final diagnoses:  Cellulitis of oral soft tissues  Broken tooth, open, initial encounter  Left ankle sprain, initial encounter      NEW MEDICATIONS STARTED DURING THIS VISIT:  New Prescriptions   AMOXICILLIN (AMOXIL) 500 MG TABLET    Take 1 tablet (500 mg total) by mouth 3 (three) times daily with meals.   FLUTICASONE (FLONASE) 50 MCG/ACT NASAL SPRAY    Place 2 sprays into both nostrils daily.   HYDROCODONE-ACETAMINOPHEN (NORCO) 5-325 MG TABLET    Take 1 tablet by mouth every 6 (six) hours as needed for severe pain.         Hope Pigeon, PA-C 05/25/16 1750  Myrna Blazer, MD 05/25/16  [phone]

## 2016-05-25 NOTE — ED Notes (Signed)
Pt verbalized understanding of discharge instructions. NAD at this time. 

## 2016-05-25 NOTE — ED Notes (Signed)
Pt arrived to ED with c/o of dental pain on the left side of her mouth that is causing pain in her ear and a headache. Pt also states she tripped and "twisted" her left ankle last night.

## 2016-05-25 NOTE — Discharge Instructions (Signed)
Dental Abscess    A dental abscess is pus in or around a tooth.  HOME CARE  Take medicines only as told by your dentist.  If you were prescribed antibiotic medicine, finish all of it even if you start to feel better.  Rinse your mouth (gargle) often with salt water.  Do not drive or use heavy machinery, like a lawn mower, while taking pain medicine.  Do not apply heat to the outside of your mouth.  Keep all follow-up visits as told by your dentist. This is important. GET HELP IF:  Your pain is worse, and medicine does not help. GET HELP RIGHT AWAY IF:  You have a fever or chills.  Your symptoms suddenly get worse.  You have a very bad headache.  You have problems breathing or swallowing.  You have trouble opening your mouth.  You have puffiness (swelling) in your neck or around your eye. This information is not intended to replace advice given to you by your health care provider. Make sure you discuss any questions you have with your health care provider.  Document Released: 03/21/2015 Document Reviewed: 03/21/2015  Elsevier Interactive Patient Education 2016 [employer]    Dental Pain    Dental pain may be caused by many things, including:  Tooth decay (cavities or caries). Cavities cause the nerve of your tooth to be open to air and hot or cold temperatures. This can cause pain or discomfort.  Abscess or infection. A dental abscess is an area that is full of infected pus from a bacterial infection in the inner part of the tooth (pulp). It usually happens at the end of the tooth's root.  Injury.  An unknown reason (idiopathic). Your pain may be mild or severe. It may only happen when:  You are chewing.  You are exposed to hot or cold temperature.  You are eating or drinking sugary foods or beverages, such as:  Soda.  Candy. Your pain may also be there all of the time.  HOME CARE  Watch your dental pain for any changes. Do these things to lessen your discomfort:  Take  medicines only as told by your dentist.  If your dentist tells you to take an antibiotic medicine, finish all of it even if you start to feel better.  Keep all follow-up visits as told by your dentist. This is important.  Do not apply heat to the outside of your face.  Rinse your mouth or gargle with salt water if told by your dentist. This helps with pain and swelling.  You can make salt water by adding  tsp of salt to 1 cup of warm water. Apply ice to the painful area of your face:  Put ice in a plastic bag.  Place a towel between your skin and the bag.  Leave the ice on for 20 minutes, 2-3 times per day. Avoid foods or drinks that cause you pain, such as:  Very hot or very cold foods or drinks.  Sweet or sugary foods or drinks. GET HELP IF:  Your pain is not helped with medicines.  Your symptoms are worse.  You have new symptoms. GET HELP RIGHT AWAY IF:  You cannot open your mouth.  You are having trouble breathing or swallowing.  You have a fever.  Your face, neck, or jaw is puffy (swollen). This information is not intended to replace advice given to you by your health care provider. Make sure you discuss any questions you have with your  health care provider.  Document Released: 04/22/2008 Document Revised: 03/21/2015 Document Reviewed: 10/31/2014  Elsevier Interactive Patient Education 2016 [employer] Inc[employer]   Ankle Sprain An ankle sprain is an injury to the strong, fibrous tissues (ligaments) that hold your ankle bones together.  HOME CARE   Put ice on your ankle for 1-2 days or as told by your doctor.  Put ice in a plastic bag.  Place a towel between your skin and the bag.  Leave the ice on for 15-20 minutes at a time, every 2 hours while you are awake.  Only take medicine as told by your doctor.  Raise (elevate) your injured ankle above the level of your heart as much as possible for 2-3 days.  Use crutches if your doctor tells you to. Slowly put your own weight on the  affected ankle. Use the crutches until you can walk without pain.  If you have a plaster splint:  Do not rest it on anything harder than a pillow for 24 hours.  Do not put weight on it.  Do not get it wet.  Take it off to shower or bathe.  If given, use an elastic wrap or support stocking for support. Take the wrap off if your toes lose feeling (numb), tingle, or turn cold or blue.  If you have an air splint:  Add or let out air to make it comfortable.  Take it off at night and to shower and bathe.  Wiggle your toes and move your ankle up and down often while you are wearing it. GET HELP IF:  You have rapidly increasing bruising or puffiness (swelling).  Your toes feel very cold.  You lose feeling in your foot.  Your medicine does not help your pain. GET HELP RIGHT AWAY IF:   Your toes lose feeling (numb) or turn blue.  You have severe pain that is increasing. MAKE SURE YOU:   Understand these instructions.  Will watch your condition.  Will get help right away if you are not doing well or get worse.   This information is not intended to replace advice given to you by your health care provider. Make sure you discuss any questions you have with your health care provider.   Document Released: 04/22/2008 Document Revised: 11/25/2014 Document Reviewed: 05/18/2012 Elsevier Interactive Patient Education 2016 [employer] Inc.  Adult nurse and RICE WHAT DOES AN ELASTIC BANDAGE DO? Elastic bandages come in different shapes and sizes. They generally provide support to your injury and reduce swelling while you are healing, but they can perform different functions. Your health care provider will help you to decide what is best for your protection, recovery, or rehabilitation following an injury. WHAT ARE SOME GENERAL TIPS FOR USING AN ELASTIC BANDAGE?  Use the bandage as directed by the maker of the bandage that you are using.  Do not wrap the bandage too tightly. This may  cut off the circulation in the arm or leg in the area below the bandage.  If part of your body beyond the bandage becomes blue, numb, cold, swollen, or is more painful, your bandage is most likely too tight. If this occurs, remove your bandage and reapply it more loosely.  See your health care provider if the bandage seems to be making your problems worse rather than better.  An elastic bandage should be removed and reapplied every 3-4 hours or as directed by your health care provider. WHAT IS RICE? The routine care of many injuries includes rest, ice,  compression, and elevation (RICE therapy).  Rest Rest is required to allow your body to heal. Generally, you can resume your routine activities when you are comfortable and have been given permission by your health care provider. Ice Icing your injury helps to keep the swelling down and it reduces pain. Do not apply ice directly to your skin.  Put ice in a plastic bag.  Place a towel between your skin and the bag.  Leave the ice on for 20 minutes, 2-3 times per day. Do this for as long as you are directed by your health care provider. Compression Compression helps to keep swelling down, gives support, and helps with discomfort. Compression may be done with an elastic bandage. Elevation Elevation helps to reduce swelling and it decreases pain. If possible, your injured area should be placed at or above the level of your heart or the center of your chest. WHEN SHOULD I SEEK MEDICAL CARE? You should seek medical care if:  You have persistent pain and swelling.  Your symptoms are getting worse rather than improving. These symptoms may indicate that further evaluation or further X-rays are needed. Sometimes, X-rays may not show a small broken bone (fracture) until a number of days later. Make a follow-up appointment with your health care provider. Ask when your X-ray results will be ready. Make sure that you get your X-ray results. WHEN  SHOULD I SEEK IMMEDIATE MEDICAL CARE? You should seek immediate medical care if:  You have a sudden onset of severe pain at or below the area of your injury.  You develop redness or increased swelling around your injury.  You have tingling or numbness at or below the area of your injury that does not improve after you remove the elastic bandage.   This information is not intended to replace advice given to you by your health care provider. Make sure you discuss any questions you have with your health care provider.   Document Released: 04/26/2002 Document Revised: 07/26/2015 Document Reviewed: 06/20/2014 Elsevier Interactive Patient Education [employer]

## 2016-12-17 ENCOUNTER — Emergency Department: Payer: Medicaid Other

## 2016-12-17 ENCOUNTER — Emergency Department
Admission: EM | Admit: 2016-12-17 | Discharge: 2016-12-18 | Disposition: A | Discharge status: Home / Self Care | Payer: Medicaid Other | Attending: Emergency Medicine | Admitting: Emergency Medicine

## 2016-12-17 ENCOUNTER — Encounter: Payer: Self-pay | Admitting: Emergency Medicine

## 2016-12-17 DIAGNOSIS — W1839XA Other fall on same level, initial encounter: Secondary | ICD-10-CM | POA: Insufficient documentation

## 2016-12-17 DIAGNOSIS — Y929 Unspecified place or not applicable: Secondary | ICD-10-CM | POA: Insufficient documentation

## 2016-12-17 DIAGNOSIS — Z79899 Other long term (current) drug therapy: Secondary | ICD-10-CM | POA: Insufficient documentation

## 2016-12-17 DIAGNOSIS — Z791 Long term (current) use of non-steroidal anti-inflammatories (NSAID): Secondary | ICD-10-CM | POA: Insufficient documentation

## 2016-12-17 DIAGNOSIS — M5442 Lumbago with sciatica, left side: Secondary | ICD-10-CM | POA: Insufficient documentation

## 2016-12-17 DIAGNOSIS — S3992XA Unspecified injury of lower back, initial encounter: Secondary | ICD-10-CM | POA: Diagnosis present

## 2016-12-17 DIAGNOSIS — Y93F2 Activity, caregiving, lifting: Secondary | ICD-10-CM | POA: Insufficient documentation

## 2016-12-17 DIAGNOSIS — F17210 Nicotine dependence, cigarettes, uncomplicated: Secondary | ICD-10-CM | POA: Insufficient documentation

## 2016-12-17 DIAGNOSIS — S39012A Strain of muscle, fascia and tendon of lower back, initial encounter: Secondary | ICD-10-CM | POA: Diagnosis not present

## 2016-12-17 DIAGNOSIS — F909 Attention-deficit hyperactivity disorder, unspecified type: Secondary | ICD-10-CM | POA: Insufficient documentation

## 2016-12-17 DIAGNOSIS — Y990 Civilian activity done for income or pay: Secondary | ICD-10-CM | POA: Diagnosis not present

## 2016-12-17 DIAGNOSIS — M5432 Sciatica, left side: Secondary | ICD-10-CM

## 2016-12-17 LAB — POCT PREGNANCY, URINE: PREG TEST UR: NEGATIVE

## 2016-12-17 MED ORDER — METHOCARBAMOL 500 MG PO TABS: 500.0000 mg | ORAL_TABLET | Freq: Four times a day (QID) | ORAL | 0 refills | Status: DC

## 2016-12-17 MED ORDER — ORPHENADRINE CITRATE 30 MG/ML IJ SOLN
60.0000 mg | Freq: Once | INTRAMUSCULAR | Status: AC
  Administered 2016-12-17: 60 mg via INTRAMUSCULAR
  Filled 2016-12-17: qty 2

## 2016-12-17 MED ORDER — KETOROLAC TROMETHAMINE 30 MG/ML IJ SOLN
30.0000 mg | Freq: Once | INTRAMUSCULAR | Status: AC
  Administered 2016-12-17: 30 mg via INTRAMUSCULAR
  Filled 2016-12-17: qty 1

## 2016-12-17 MED ORDER — MELOXICAM 15 MG PO TABS: 15.0000 mg | ORAL_TABLET | Freq: Every day | ORAL | 0 refills | Status: DC

## 2016-12-17 NOTE — ED Triage Notes (Signed)
Pt reports injuring back yesterday. 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 Pain started as lower back pain and progressively has gotten worse and is now shooting down her left leg. Pt is able to ambulate with full movement.  Sitting amplifies the leg pain per report. Sensation and pulses present in all extremities.

## 2016-12-17 NOTE — ED Provider Notes (Signed)
[facility] Emergency Department Provider Note  ____________________________________________  Time seen: Approximately 11:13 PM  I have reviewed the triage vital signs and the nursing notes.   HISTORY  Chief Complaint Back Pain    HPI Caitlin Reid is a 36 y.o. female who presents emergency department complaining of lower back pain radiating into her left leg. 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 Patient states that initially it was tight and sore the pain has worsened and now is radiating into her left leg. She denies any history of sciatica. Patient has been taking Motrin which helps somewhat but does not fully alleviate symptoms. She denies any follow-up for definitive, saddle anesthesia, paresthesias.   Past Medical History:  Diagnosis Date  . ADHD (attention deficit hyperactivity disorder)   . Allergy   . Anxiety   . Foot pain, left   . Fracture of coccyx (HCC)   . Heel spur left  . Insomnia   . Kidney stones   . Pleurisy    Patient reports that she has had this twice  . PTSD (post-traumatic stress disorder)     Patient Active Problem List   Diagnosis Date Noted  . Chronic pain 04/10/2016  . Long term current use of opiate analgesic 04/10/2016  . Long term prescription opiate use 04/10/2016  . Opiate use (22.5 MME/Day) 04/10/2016  . Encounter for therapeutic drug level monitoring 04/10/2016  . Encounter for pain management planning 04/10/2016  . Chronic low back pain (Location of Primary Source of Pain) (Right) 04/10/2016  . Lumbar spondylosis (L5-S1 DDD) 04/10/2016  . Chronic sacroiliac joint pain (Right) 04/10/2016  . Lumbar facet syndrome (Right) 04/10/2016  . Chronic knee pain (Location of Tertiary source of pain) (Right) 04/10/2016  . Levoscoliosis 04/10/2016  . Lumbar facet arthropathy (L3-4 and L5-S1) 04/10/2016  . L5-S1  lumbar bulging disc (Right) 04/10/2016  . Chronic lower extremity pain (Right) 04/10/2016  . Chronic hip pain (Location of Secondary source of pain) (Right) 04/10/2016  . Chronic lumbar radicular pain (Right) 04/10/2016    Past Surgical History:  Procedure Laterality Date  . Bone Spur    . CESAREAN SECTION    . TUBAL LIGATION      Prior to Admission medications   Medication Sig Start Date End Date Taking? Authorizing Provider  ALPRAZolam Prudy Feeler) 1 MG tablet Take 1 mg by mouth 3 (three) times daily as needed for anxiety.    Historical Provider, MD  amoxicillin (AMOXIL) 500 MG tablet Take 1 tablet (500 mg total) by mouth 3 (three) times daily with meals. 05/25/16   Jami L Hagler, PA-C  cephALEXin (KEFLEX) 500 MG capsule Take 1 capsule (500 mg total) by mouth 3 (three) times daily. 04/25/16   Jami L Hagler, PA-C  fluticasone (FLONASE) 50 MCG/ACT nasal spray Place 2 sprays into both nostrils daily. 05/25/16   Jami L Hagler, PA-C  HYDROcodone-acetaminophen (NORCO) 5-325 MG tablet Take 1 tablet by mouth every 6 (six) hours as needed for severe pain. 05/25/16   Jami L Hagler, PA-C  ibuprofen (ADVIL,MOTRIN) 800 MG tablet Take 1 tablet (800 mg total) by mouth every 8 (eight) hours as needed. 10/31/15   Joni Reining, PA-C  meloxicam (MOBIC) 15 MG tablet Take 1 tablet (15 mg total) by mouth daily. 12/17/16   Delorise Royals Brayam Boeke, PA-C  methocarbamol (ROBAXIN) 500 MG tablet Take 1 tablet (  500 mg total) by mouth 4 (four) times daily. 12/17/16   Delorise Royals Jonnette Nuon, PA-C    Allergies Doxycycline; Tetracyclines & related; and Other  No family history on file.  Social History Social History  Substance Use Topics  . Smoking status: Current Every Day Smoker    Packs/day: 0.33    Types: Cigarettes  . Smokeless tobacco: Never Used  . Alcohol use No     Review of Systems  Constitutional: No fever/chills Eyes: No visual changes.  Cardiovascular: no chest pain. Respiratory: no cough. No  SOB. Gastrointestinal: No abdominal pain.  No nausea, no vomiting.  Genitourinary: Negative for dysuria. No hematuria Musculoskeletal: For lower back pain radiating into left leg. Skin: Negative for rash, abrasions, lacerations, ecchymosis. Neurological: Negative for headaches, focal weakness or numbness. 10-point ROS otherwise negative.  ____________________________________________   PHYSICAL EXAM:  VITAL SIGNS: ED Triage Vitals  Enc Vitals Group     BP 12/17/16 2055 (!) 141/79     Pulse Rate 12/17/16 2055 97     Resp 12/17/16 2055 18     Temp 12/17/16 2055 98.9 F (37.2 C)     Temp Source 12/17/16 2055 Oral     SpO2 12/17/16 2055 100 %     Weight 12/17/16 2055 170 lb (77.1 kg)     Height 12/17/16 2055 5\' 2"  (1.575 m)     Head Circumference --      Peak Flow --      Pain Score 12/17/16 2102 8     Pain Loc --      Pain Edu? --      Excl. in GC? --      Constitutional: Alert and oriented. Well appearing and in no acute distress. Eyes: Conjunctivae are normal. PERRL. EOMI. Head: Atraumatic. Neck: No stridor.    Cardiovascular: Normal rate, regular rhythm. Normal S1 and S2.  Good peripheral circulation. Respiratory: Normal respiratory effort without tachypnea or retractions. Lungs CTAB. Good air entry to the bases with no decreased or absent breath sounds. Gastrointestinal: Bowel sounds 4 quadrants. Soft and nontender to palpation. No guarding or rigidity. No palpable masses. No distention. No CVA tenderness. Musculoskeletal: Full range of motion to all extremities. No gross deformities appreciated. No deformities displayed upon inspection. Full range of motion lumbar spine. Patient is tender to palpation midline in the L3-L5 region. She is very tender to palpation left paraspinal muscle group and less so on the right. Positive tenderness to palpation over the sciatic notch left side. No tenderness to palpation right sciatic notch. Dorsalis pedis pulse intact bilateral lower  studies. Sensation intact and equal lower extremities. Neurologic:  Normal speech and language. No gross focal neurologic deficits are appreciated.  Skin:  Skin is warm, dry and intact. No rash noted. Psychiatric: Mood and affect are normal. Speech and behavior are normal. Patient exhibits appropriate insight and judgement.   ____________________________________________   LABS (all labs ordered are listed, but only abnormal results are displayed)  Labs Reviewed  POCT PREGNANCY, URINE   ____________________________________________  EKG   ____________________________________________  RADIOLOGY [address] Spatz, personally viewed and evaluated these images (plain radiographs) as part of my medical decision making, as well as reviewing the written report by the radiologist.  Dg Lumbar Spine 2-3 Views  Result Date: 12/17/2016 CLINICAL DATA:  Buttock and left leg pain after falling today. EXAM: LUMBAR SPINE - 2-3 VIEW COMPARISON:  None. FINDINGS: There is no evidence of lumbar spine fracture. Alignment is normal. Intervertebral disc spaces  are maintained. IMPRESSION: Negative. Electronically Signed   By: Ellery Plunk M.D.   On: 12/17/2016 22:19    ____________________________________________    PROCEDURES  Procedure(s) performed:    Procedures    Medications  ketorolac (TORADOL) 30 MG/ML injection 30 mg (30 mg Intramuscular Given 12/17/16 2319)  orphenadrine (NORFLEX) injection 60 mg (60 mg Intramuscular Given 12/17/16 2320)     ____________________________________________   INITIAL IMPRESSION / ASSESSMENT AND PLAN / ED COURSE  Pertinent labs & imaging results that were available during my care of the patient were reviewed by me and considered in my medical decision making (see chart for details).  Review of the [place] CSRS was performed in accordance of the [facility] prior to dispensing any controlled drugs.     Patient's diagnosis is consistent with Strain of  the left lower lumbar muscle group with sciatica of the left side. X-ray reveals no acute osseous abnormality. Exam is reassuring. Patient has no concerning symptoms warranting MRI at this time. She was given Toradol and muscle relaxer injections in the emergency department.. Patient will be discharged home with prescriptions for anti-inflammatories and muscle relaxer. Patient is to follow up with primary care as needed or otherwise directed. Patient is given ED precautions to return to the ED for any worsening or new symptoms.     ____________________________________________  FINAL CLINICAL IMPRESSION(S) / ED DIAGNOSES  Final diagnoses:  Strain of lumbar region, initial encounter  Sciatica of left side      NEW MEDICATIONS STARTED DURING THIS VISIT:  New Prescriptions   MELOXICAM (MOBIC) 15 MG TABLET    Take 1 tablet (15 mg total) by mouth daily.   METHOCARBAMOL (ROBAXIN) 500 MG TABLET    Take 1 tablet (500 mg total) by mouth 4 (four) times daily.        This chart was dictated using voice recognition software/Dragon. Despite best efforts to proofread, errors can occur which can change the meaning. Any change was purely unintentional.    Racheal Patches, PA-C 12/17/16 2342    Nita Sickle, MD 12/18/16 [phone]

## 2016-12-18 NOTE — ED Notes (Signed)
Pt discharged to home.  Family member driving.  Discharge instructions reviewed.  Verbalized understanding.  No questions or concerns at this time.  Teach back verified.  Pt in NAD.  No items left in ED.   

## 2017-01-27 ENCOUNTER — Encounter: Payer: Self-pay | Admitting: Emergency Medicine

## 2017-01-27 ENCOUNTER — Emergency Department
Admission: EM | Admit: 2017-01-27 | Discharge: 2017-01-27 | Disposition: A | Discharge status: Home / Self Care | Payer: Medicaid Other | Attending: Emergency Medicine | Admitting: Emergency Medicine

## 2017-01-27 ENCOUNTER — Emergency Department: Payer: Medicaid Other

## 2017-01-27 DIAGNOSIS — R609 Edema, unspecified: Secondary | ICD-10-CM | POA: Diagnosis not present

## 2017-01-27 DIAGNOSIS — J40 Bronchitis, not specified as acute or chronic: Secondary | ICD-10-CM | POA: Diagnosis not present

## 2017-01-27 DIAGNOSIS — Z79899 Other long term (current) drug therapy: Secondary | ICD-10-CM | POA: Diagnosis not present

## 2017-01-27 DIAGNOSIS — F17210 Nicotine dependence, cigarettes, uncomplicated: Secondary | ICD-10-CM | POA: Diagnosis not present

## 2017-01-27 DIAGNOSIS — F909 Attention-deficit hyperactivity disorder, unspecified type: Secondary | ICD-10-CM | POA: Insufficient documentation

## 2017-01-27 DIAGNOSIS — R0602 Shortness of breath: Secondary | ICD-10-CM | POA: Diagnosis present

## 2017-01-27 HISTORY — DX: Acute parametritis and pelvic cellulitis: N73.0

## 2017-01-27 LAB — URINALYSIS, COMPLETE (UACMP) WITH MICROSCOPIC
BACTERIA UA: NONE SEEN
BILIRUBIN URINE: NEGATIVE
Glucose, UA: NEGATIVE mg/dL
HGB URINE DIPSTICK: NEGATIVE
Ketones, ur: NEGATIVE mg/dL
LEUKOCYTES UA: NEGATIVE
Nitrite: NEGATIVE
PROTEIN: NEGATIVE mg/dL
RBC / HPF: NONE SEEN RBC/hpf (ref 0–5)
Specific Gravity, Urine: 1.006 (ref 1.005–1.030)
pH: 6 (ref 5.0–8.0)

## 2017-01-27 LAB — CBC
HCT: 37.5 % (ref 35.0–47.0)
Hemoglobin: 13 g/dL (ref 12.0–16.0)
MCH: 30.3 pg (ref 26.0–34.0)
MCHC: 34.6 g/dL (ref 32.0–36.0)
MCV: 87.6 fL (ref 80.0–100.0)
PLATELETS: 315 10*3/uL (ref 150–440)
RBC: 4.29 MIL/uL (ref 3.80–5.20)
RDW: 13 % (ref 11.5–14.5)
WBC: 13.6 10*3/uL — AB (ref 3.6–11.0)

## 2017-01-27 LAB — BASIC METABOLIC PANEL
Anion gap: 6 (ref 5–15)
BUN: 19 mg/dL (ref 6–20)
CALCIUM: 9.2 mg/dL (ref 8.9–10.3)
CO2: 29 mmol/L (ref 22–32)
CREATININE: 0.79 mg/dL (ref 0.44–1.00)
Chloride: 101 mmol/L (ref 101–111)
GFR calc non Af Amer: >60 mL/min (ref >60)
Glucose, Bld: 97 mg/dL (ref 65–99)
Potassium: 3.4 mmol/L — ABNORMAL LOW (ref 3.5–5.1)
SODIUM: 136 mmol/L (ref 135–145)

## 2017-01-27 LAB — HEPATIC FUNCTION PANEL
ALBUMIN: 3.8 g/dL (ref 3.5–5.0)
ALT: 22 U/L (ref 14–54)
AST: 33 U/L (ref 15–41)
Alkaline Phosphatase: 47 U/L (ref 38–126)
BILIRUBIN TOTAL: 1 mg/dL (ref 0.3–1.2)
Bilirubin, Direct: 0.1 mg/dL (ref 0.1–0.5)
Indirect Bilirubin: 0.9 mg/dL (ref 0.3–0.9)
TOTAL PROTEIN: 6.9 g/dL (ref 6.5–8.1)

## 2017-01-27 LAB — BRAIN NATRIURETIC PEPTIDE: B Natriuretic Peptide: 35 pg/mL (ref 0.0–100.0)

## 2017-01-27 LAB — TSH: TSH: 2.254 u[IU]/mL (ref 0.350–4.500)

## 2017-01-27 LAB — TROPONIN I: Troponin I: <0.03 ng/mL (ref <0.03)

## 2017-01-27 MED ORDER — POTASSIUM CHLORIDE 20 MEQ PO PACK: 20.0000 meq | PACK | Freq: Every day | ORAL | 0 refills | Status: DC

## 2017-01-27 MED ORDER — AZITHROMYCIN 250 MG PO TABS: ORAL_TABLET | ORAL | 0 refills | Status: AC

## 2017-01-27 MED ORDER — FUROSEMIDE 10 MG/ML IJ SOLN
40.0000 mg | Freq: Once | INTRAMUSCULAR | Status: AC
  Administered 2017-01-27: 40 mg via INTRAVENOUS

## 2017-01-27 MED ORDER — FUROSEMIDE 10 MG/ML IJ SOLN
INTRAMUSCULAR | Status: AC
  Filled 2017-01-27: qty 4

## 2017-01-27 MED ORDER — ALBUTEROL SULFATE HFA 108 (90 BASE) MCG/ACT IN AERS: 2.0000 | INHALATION_SPRAY | Freq: Four times a day (QID) | RESPIRATORY_TRACT | 0 refills | Status: DC | PRN

## 2017-01-27 MED ORDER — TRAMADOL HCL 50 MG PO TABS: 50.0000 mg | ORAL_TABLET | Freq: Once | ORAL | Status: DC

## 2017-01-27 MED ORDER — FUROSEMIDE 20 MG PO TABS: 20.0000 mg | ORAL_TABLET | Freq: Every day | ORAL | 0 refills | Status: DC

## 2017-01-27 NOTE — ED Notes (Signed)
Pt returned from ultrasound. Pt up to urinate. Pt updated on plan of care. Pt verbalizes understanding.

## 2017-01-27 NOTE — ED Triage Notes (Addendum)
Patient with complaint of bilateral hand swelling that started months ago. Patient states that today her bilateral feet, legs and eye swelling. Patient also reports cough that started today. Patient also states that she has some shortness of breath.

## 2017-01-27 NOTE — ED Notes (Signed)
Pt states has had intermittent bilateral hand swelling for 6 months. Pt states since yesterday has had bilateral lower extremity edema from knees to feet. Pt with 3+ non pitting bilateral pedal edema, 1+ bilateral tibial edema, non pitting. No edema noted to upper extremities. Pt denies orthopnea. Pt states has had a cough since yesterday. Pt is very anxious. Pt states she has been on web md and googled her symptoms. Pt states she is afraid she has heart, kidney and liver failure.

## 2017-01-27 NOTE — ED Notes (Signed)
Patient transported to Ultrasound 

## 2017-01-27 NOTE — ED Notes (Signed)
Patient transported to X-ray 

## 2017-01-27 NOTE — ED Notes (Signed)
Pt is less anxious. Pt disconnected from monitoring equipment to enable pt to void.

## 2017-01-27 NOTE — ED Provider Notes (Signed)
[facility] Emergency Department Provider Note   ____________________________________________   First MD Initiated Contact with Patient 01/27/17 0114     (approximate)  I have reviewed the triage vital signs and the nursing notes.   HISTORY  Chief Complaint Leg Swelling and Cough    HPI Caitlin Reid is a 36 y.o. female who comes into the hospital today with some swelling in her hands and legs. She reports that 6 months to a year ago she was waking up daily with swelling to her hands. She thought it was water retention and never followed up with her doctor. She reports that typically by lunchtime the swelling would be gone. She reports that she woke up today with some swelling to her right eye and drainage. She reports that it was so swollen she couldn't see. When she looked down her legs her feet were swollen as well. She reports that it's been getting bigger and bigger all-day especially on the right. She reports that she was looking on Web M.D. and is concerned about possible kidney failure heart failure or liver failure. The patient has a history of kidney infections and reports that she's been having some recent urinary frequency. She has not seen a doctor but has been taking Azo. She states that she also has a pins and needles feeling to her feet. Her abdomen is also swollen and she is having to wear more close. She states that she has been gaining weight which is not typical for her. She denies any chest pain but has had a bit of a cough with some shortness of breath. She has had no dyspnea on exertion and no paroxysmal nocturnal dyspnea. She denies any nausea or vomiting. She also denies any blurred vision. The patient was concerned so she decided to come into the hospital today for evaluation.   Past Medical History:  Diagnosis Date  . ADHD (attention deficit hyperactivity disorder)   . Allergy   . Anxiety   . Foot pain, left   . Fracture of coccyx  (HCC)   . Heel spur left  . Insomnia   . Kidney stones   . PID (acute pelvic inflammatory disease)   . Pleurisy    Patient reports that she has had this twice  . PTSD (post-traumatic stress disorder)     Patient Active Problem List   Diagnosis Date Noted  . Chronic pain 04/10/2016  . Long term current use of opiate analgesic 04/10/2016  . Long term prescription opiate use 04/10/2016  . Opiate use (22.5 MME/Day) 04/10/2016  . Encounter for therapeutic drug level monitoring 04/10/2016  . Encounter for pain management planning 04/10/2016  . Chronic low back pain (Location of Primary Source of Pain) (Right) 04/10/2016  . Lumbar spondylosis (L5-S1 DDD) 04/10/2016  . Chronic sacroiliac joint pain (Right) 04/10/2016  . Lumbar facet syndrome (Right) 04/10/2016  . Chronic knee pain (Location of Tertiary source of pain) (Right) 04/10/2016  . Levoscoliosis 04/10/2016  . Lumbar facet arthropathy (L3-4 and L5-S1) 04/10/2016  . L5-S1 lumbar bulging disc (Right) 04/10/2016  . Chronic lower extremity pain (Right) 04/10/2016  . Chronic hip pain (Location of Secondary source of pain) (Right) 04/10/2016  . Chronic lumbar radicular pain (Right) 04/10/2016    Past Surgical History:  Procedure Laterality Date  . Bone Spur    . CESAREAN SECTION    . TUBAL LIGATION      Prior to Admission medications   Medication Sig Start Date End Date  Taking? Authorizing Provider  albuterol (PROVENTIL HFA;VENTOLIN HFA) 108 (90 Base) MCG/ACT inhaler Inhale 2 puffs into the lungs every 6 (six) hours as needed for wheezing or shortness of breath. 01/27/17   Rebecka Apley, MD  ALPRAZolam Prudy Feeler) 1 MG tablet Take 1 mg by mouth 3 (three) times daily as needed for anxiety.    Historical Provider, MD  amoxicillin (AMOXIL) 500 MG tablet Take 1 tablet (500 mg total) by mouth 3 (three) times daily with meals. 05/25/16   Jami L Hagler, PA-C  azithromycin (ZITHROMAX Z-PAK) 250 MG tablet Take 2 tablets (500 mg) on  Day 1,   followed by 1 tablet (250 mg) once daily on Days 2 through 5. 01/27/17 02/01/17  Rebecka Apley, MD  cephALEXin (KEFLEX) 500 MG capsule Take 1 capsule (500 mg total) by mouth 3 (three) times daily. 04/25/16   Jami L Hagler, PA-C  fluticasone (FLONASE) 50 MCG/ACT nasal spray Place 2 sprays into both nostrils daily. 05/25/16   Jami L Hagler, PA-C  furosemide (LASIX) 20 MG tablet Take 1 tablet (20 mg total) by mouth daily. 01/27/17 01/27/18  Rebecka Apley, MD  HYDROcodone-acetaminophen (NORCO) 5-325 MG tablet Take 1 tablet by mouth every 6 (six) hours as needed for severe pain. 05/25/16   Jami L Hagler, PA-C  ibuprofen (ADVIL,MOTRIN) 800 MG tablet Take 1 tablet (800 mg total) by mouth every 8 (eight) hours as needed. 10/31/15   Joni Reining, PA-C  meloxicam (MOBIC) 15 MG tablet Take 1 tablet (15 mg total) by mouth daily. 12/17/16   Delorise Royals Cuthriell, PA-C  methocarbamol (ROBAXIN) 500 MG tablet Take 1 tablet (500 mg total) by mouth 4 (four) times daily. 12/17/16   Delorise Royals Cuthriell, PA-C  potassium chloride (KLOR-CON) 20 MEQ packet Take 20 mEq by mouth daily. 01/27/17   Rebecka Apley, MD    Allergies Doxycycline; Tetracyclines & related; and Other  No family history on file.  Social History Social History  Substance Use Topics  . Smoking status: Current Every Day Smoker    Packs/day: 0.33    Types: Cigarettes  . Smokeless tobacco: Never Used  . Alcohol use No    Review of Systems Constitutional: No fever/chills Eyes: No visual changes. ENT: No sore throat. Cardiovascular: Denies chest pain. Respiratory: cough and shortness of breath. Gastrointestinal: No abdominal pain.  No nausea, no vomiting.  No diarrhea.  No constipation. Genitourinary: Negative for dysuria. Musculoskeletal: Negative for back pain. Skin: Negative for rash. Neurological: dizziness Hematological/Lymphatic:Swelling to hands and feet  10-point ROS otherwise  negative.  ____________________________________________   PHYSICAL EXAM:  VITAL SIGNS: ED Triage Vitals  Enc Vitals Group     BP 01/27/17 0038 134/85     Pulse Rate 01/27/17 0038 (!) 110     Resp 01/27/17 0038 18     Temp 01/27/17 0038 98.7 F (37.1 C)     Temp Source 01/27/17 0038 Oral     SpO2 01/27/17 0038 100 %     Weight 01/27/17 0038 175 lb (79.4 kg)     Height 01/27/17 0038 5\' 2"  (1.575 m)     Head Circumference --      Peak Flow --      Pain Score 01/27/17 0039 8     Pain Loc --      Pain Edu? --      Excl. in GC? --     Constitutional: Alert and oriented. Well appearing and in moderate distress. Eyes: Conjunctivae are normal. PERRL.  EOMI. Head: Atraumatic. Nose: No congestion/rhinnorhea. Mouth/Throat: Mucous membranes are moist.  Oropharynx non-erythematous. Cardiovascular: Normal rate, regular rhythm. Grossly normal heart sounds.  Good peripheral circulation. Respiratory: Normal respiratory effort.  No retractions. Lungs CTAB. Gastrointestinal: Soft and nontender. No distention. positive bowel sounds Musculoskeletal: lower extremity edema Neurologic:  Normal speech and language. Skin:  Skin is warm, dry and intact.  Psychiatric: Mood and affect are normal.   ____________________________________________   LABS (all labs ordered are listed, but only abnormal results are displayed)  Labs Reviewed  BASIC METABOLIC PANEL - Abnormal; Notable for the following:       Result Value   Potassium 3.4 (*)    All other components within normal limits  CBC - Abnormal; Notable for the following:    WBC 13.6 (*)    All other components within normal limits  URINALYSIS, COMPLETE (UACMP) WITH MICROSCOPIC - Abnormal; Notable for the following:    Color, Urine STRAW (*)    APPearance CLEAR (*)    Squamous Epithelial / LPF 0-5 (*)    All other components within normal limits  TROPONIN I  BRAIN NATRIURETIC PEPTIDE  HEPATIC FUNCTION PANEL  TSH    ____________________________________________  EKG  ED ECG REPORT I, Rebecka Apley, the attending physician, personally viewed and interpreted this ECG.   Date: 01/27/2017  EKG Time: 0048  Rate: 107  Rhythm: sinus tachycardia  Axis: normal  Intervals:none  ST&T Change: none  ____________________________________________  RADIOLOGY  CXR ____________________________________________   PROCEDURES  Procedure(s) performed: None  Procedures  Critical Care performed: No  ____________________________________________   INITIAL IMPRESSION / ASSESSMENT AND PLAN / ED COURSE  Pertinent labs & imaging results that were available during my care of the patient were reviewed by me and considered in my medical decision making (see chart for details).  This is a 36 year old female who comes into the hospital today with some swelling to her hands and feet. The patient reports that this is been going on for a year but the feet are new today. The patient is very concerned that she may have some organ failure causing some of her symptoms. We will check some blood work. The patient does have some edema as I will give her dose of Lasix. I will also send the patient for an ultrasound looking at her legs and I will also check a TSH on the patient. She will be reassessed once I received the results of her blood work.  Clinical Course as of Jan 27 809  Mon Jan 27, 2017  0115 Mild peribronchial thickening may indicate bronchitis. DG Chest 2 View [AW]  [phone] No evidence of deep venous thrombosis. US Venous Img Lower Bilateral [AW]    Clinical Course User Index [AW] Rebecka Apley, MD   The patient has some improvement in her symptoms. She will be discharged to home.    ____________________________________________   FINAL CLINICAL IMPRESSION(S) / ED DIAGNOSES  Final diagnoses:  Peripheral edema  Bronchitis      NEW MEDICATIONS STARTED DURING THIS VISIT:  Discharge  Medication List as of 01/27/2017  5:01 AM    START taking these medications   Details  albuterol (PROVENTIL HFA;VENTOLIN HFA) 108 (90 Base) MCG/ACT inhaler Inhale 2 puffs into the lungs every 6 (six) hours as needed for wheezing or shortness of breath., Starting Mon 01/27/2017, Print    azithromycin (ZITHROMAX Z-PAK) 250 MG tablet Take 2 tablets (500 mg) on  Day 1,  followed by 1 tablet (250  mg) once daily on Days 2 through 5., Print    furosemide (LASIX) 20 MG tablet Take 1 tablet (20 mg total) by mouth daily., Starting Mon 01/27/2017, Until Tue 01/27/2018, Print    potassium chloride (KLOR-CON) 20 MEQ packet Take 20 mEq by mouth daily., Starting Mon 01/27/2017, Print         Note:  This document was prepared using Dragon voice recognition software and may include unintentional dictation errors.    Rebecka Apley, MD 01/27/17 [phone]

## 2017-01-27 NOTE — ED Notes (Signed)
Pt up to commode to provide a urine sample. Pt with decreased swelling noted to bilateral lower extremities. Pt updated on plan of care. Pt verbalizes understanding.

## 2017-01-27 NOTE — ED Notes (Signed)
Pt updated on need for urine sample. Pt verbalizes understanding. Pt texting on phone in no acute distress.

## 2017-03-11 ENCOUNTER — Ambulatory Visit (INDEPENDENT_AMBULATORY_CARE_PROVIDER_SITE_OTHER): Payer: Medicaid Other | Admitting: Family Medicine

## 2017-03-11 ENCOUNTER — Encounter: Payer: Self-pay | Admitting: Family Medicine

## 2017-03-11 VITALS — BP 133/85 | HR 112 | Temp 98.8°F | Ht 65.5 in | Wt 202.8 lb

## 2017-03-11 DIAGNOSIS — Z7689 Persons encountering health services in other specified circumstances: Secondary | ICD-10-CM

## 2017-03-11 DIAGNOSIS — F411 Generalized anxiety disorder: Secondary | ICD-10-CM | POA: Diagnosis not present

## 2017-03-11 DIAGNOSIS — F321 Major depressive disorder, single episode, moderate: Secondary | ICD-10-CM | POA: Diagnosis not present

## 2017-03-11 MED ORDER — FLUOXETINE HCL 20 MG PO TABS: 20.0000 mg | ORAL_TABLET | Freq: Every day | ORAL | 1 refills | Status: DC

## 2017-03-11 NOTE — Progress Notes (Signed)
BP 133/85 (BP Location: Left Arm, Patient Position: Sitting, Cuff Size: Normal)   Pulse (!) 112   Temp 98.8 F (37.1 C)   Ht 5' 5.5" (1.664 m)   Wt 202 lb 12.8 oz (92 kg)   SpO2 97%   BMI 33.23 kg/m    Subjective:    Patient ID: Caitlin Reid, female    DOB: 06-17-1981, 36 y.o.   MRN: [id number]  HPI: Caitlin Reid is a 36 y.o. female  Chief Complaint  Patient presents with  . Establish Care  . Anxiety    Patient use to live at [facility]. Would go to see her Pyschiatrist and would give her 3 month supply of Xanax. Patient states she can't keep driving back and forth.   . Depression   Patient presents today to establish care. Was previously followed by Psychiatry for PTSD, Panic Disorder, Generalized Anxiety Disorder, Depression, ADD, and insomnia. States anxiety disorder runs strongly in her family, and she was first diagnosed around age 36. Feels the depression started after having her 3rd and final child 3 years ago. Has been out of all medications for about 4 months now due to a move from [facility] to here locally. Since being off medications, pt feels very out of control of her emotions and is having a difficult time coping with daily life. Having severe and frequent panic episodes where she shakes, vomits, sweats, and becomes SOB. Feels a lack of interest, pleasure, and motivation. Frequent crying spells. Denies SI/HI.  Gets shakes, throws up, sweats, SOB.   Has tried wellbutrin and paxil - was then told it was ADD but the medications never helped with her sxs so she is no longer on stimulant medications. Has tried multiple BZDs in the past and had the most success with 1 mg xanax TID.   Is UTD on CPE. Has chronic back and joint pains, has been followed by pain management for these.   Past Medical History:  Diagnosis Date  . ADHD (attention deficit hyperactivity disorder)   . Allergy   . Anxiety   . Foot pain, left   . Fracture of coccyx (HCC)   . Heel  spur left  . Insomnia   . Kidney stones   . PID (acute pelvic inflammatory disease)   . Pleurisy    Patient reports that she has had this twice  . PTSD (post-traumatic stress disorder)    Social History   Social History  . Marital status: Single    Spouse name: N/A  . Number of children: N/A  . Years of education: N/A   Occupational History  . Not on file.   Social History Main Topics  . Smoking status: Current Every Day Smoker    Packs/day: 0.33    Types: Cigarettes  . Smokeless tobacco: Never Used  . Alcohol use No  . Drug use: No  . Sexual activity: Not on file   Other Topics Concern  . Not on file   Social History Narrative  . No narrative on file    Relevant past medical, surgical, family and social history reviewed and updated as indicated. Interim medical history since our last visit reviewed. Allergies and medications reviewed and updated.  Review of Systems  Constitutional: Negative.   HENT: Negative.   Respiratory: Negative.   Cardiovascular: Negative.   Gastrointestinal: Negative.   Genitourinary: Negative.   Musculoskeletal: Negative.   Neurological: Negative.   Psychiatric/Behavioral: Positive for dysphoric mood and sleep disturbance. Negative  for suicidal ideas. The patient is nervous/anxious.     Per HPI unless specifically indicated above     Objective:    BP 133/85 (BP Location: Left Arm, Patient Position: Sitting, Cuff Size: Normal)   Pulse (!) 112   Temp 98.8 F (37.1 C)   Ht 5' 5.5" (1.664 m)   Wt 202 lb 12.8 oz (92 kg)   SpO2 97%   BMI 33.23 kg/m   Wt Readings from Last 3 Encounters:  03/12/17 202 lb (91.6 kg)  03/11/17 202 lb 12.8 oz (92 kg)  01/27/17 175 lb (79.4 kg)    Physical Exam  Constitutional: She is oriented to person, place, and time. She appears well-developed and well-nourished. No distress.  HENT:  Head: Atraumatic.  Eyes: Conjunctivae are normal. Pupils are equal, round, and reactive to light.  Neck: Normal  range of motion. Neck supple.  Cardiovascular: Normal rate and normal heart sounds.   Pulmonary/Chest: Effort normal and breath sounds normal. No respiratory distress.  Musculoskeletal: Normal range of motion.  Neurological: She is alert and oriented to person, place, and time.  Skin: Skin is warm and dry.  Psychiatric: She has a normal mood and affect. Her behavior is normal.  tearful      Assessment & Plan:   Problem List Items Addressed This Visit      Other   Generalized anxiety disorder    Discussed office policy of no controlled substances on first visit. Pt agreeable to come back and review our controlled substance agreement. I will restart her xanax once signed and bridge her until she can get into the Psychiatrist. Precautions reviewed in detail.       Relevant Orders   Ambulatory referral to Psychiatry   Depression, major, single episode, moderate (HCC)    Will start her on Prozac and monitor closely. Psychiatry referral placed. Discussed risks and benefits, pt understanding of plan.       Relevant Medications   FLUoxetine (PROZAC) 20 MG tablet    Other Visit Diagnoses    Encounter to establish care    -  Primary       Follow up plan: Return in about 1 day (around 03/12/2017) for Controlled substance Agreement, anxiety.

## 2017-03-12 ENCOUNTER — Ambulatory Visit (INDEPENDENT_AMBULATORY_CARE_PROVIDER_SITE_OTHER): Payer: Medicaid Other | Admitting: Family Medicine

## 2017-03-12 ENCOUNTER — Encounter: Payer: Self-pay | Admitting: Family Medicine

## 2017-03-12 VITALS — BP 119/84 | HR 88 | Temp 98.7°F | Wt 202.0 lb

## 2017-03-12 DIAGNOSIS — Z79899 Other long term (current) drug therapy: Secondary | ICD-10-CM

## 2017-03-12 DIAGNOSIS — F411 Generalized anxiety disorder: Secondary | ICD-10-CM | POA: Diagnosis not present

## 2017-03-12 DIAGNOSIS — Z91148 Patient's other noncompliance with medication regimen for other reason: Secondary | ICD-10-CM | POA: Insufficient documentation

## 2017-03-12 DIAGNOSIS — Z9114 Patient's other noncompliance with medication regimen: Secondary | ICD-10-CM | POA: Insufficient documentation

## 2017-03-12 MED ORDER — ALPRAZOLAM 1 MG PO TABS: 1.0000 mg | ORAL_TABLET | Freq: Three times a day (TID) | ORAL | 0 refills | Status: DC | PRN

## 2017-03-12 NOTE — Progress Notes (Signed)
   BP 119/84   Pulse 88   Temp 98.7 F (37.1 C)   Wt 202 lb (91.6 kg)   SpO2 99%   BMI 33.10 kg/m    Subjective:    Patient ID: Caitlin Reid, female    DOB: 14-Feb-1981, 36 y.o.   MRN: [id number]  HPI: Caitlin Reid is a 36 y.o. female  Chief Complaint  Patient presents with  . Anxiety  . Insomnia    wants to know if there is anything she could take for it.    Patient presents for her first anxiety f/u after Establishing Care. Has started the Prozac and is wanting to get back on her xanax. Off all medications x 4 months due to moving from [facility]. Having a significant amount of difficulty coping since being off medications. Regular counseling sessions with pastor may be helping some, but pt not sleeping or finding the energy or motivation to take care of her responsibilities at this point. Having frequent, daily panic episodes. Wanting something for her severe insomnia as well. Was previously on ambien prn.   Relevant past medical, surgical, family and social history reviewed and updated as indicated. Interim medical history since our last visit reviewed. Allergies and medications reviewed and updated.  Review of Systems  Constitutional: Negative.   HENT: Negative.   Eyes: Negative.   Respiratory: Negative.   Cardiovascular: Negative.   Gastrointestinal: Negative.   Genitourinary: Negative.   Musculoskeletal: Negative.   Neurological: Negative.   Psychiatric/Behavioral: Positive for dysphoric mood and sleep disturbance. The patient is nervous/anxious.     Per HPI unless specifically indicated above     Objective:    BP 119/84   Pulse 88   Temp 98.7 F (37.1 C)   Wt 202 lb (91.6 kg)   SpO2 99%   BMI 33.10 kg/m   Wt Readings from Last 3 Encounters:  03/12/17 202 lb (91.6 kg)  03/11/17 202 lb 12.8 oz (92 kg)  01/27/17 175 lb (79.4 kg)    Physical Exam  Constitutional: She is oriented to person, place, and time. She appears well-developed and  well-nourished.  HENT:  Head: Atraumatic.  Eyes: Conjunctivae are normal. Pupils are equal, round, and reactive to light.  Neck: Normal range of motion. Neck supple.  Cardiovascular: Normal rate and normal heart sounds.   Pulmonary/Chest: Effort normal. No respiratory distress.  Musculoskeletal: Normal range of motion.  Neurological: She is alert and oriented to person, place, and time.  Skin: Skin is warm and dry.  Psychiatric:  tearful  Nursing note and vitals reviewed.     Assessment & Plan:   Problem List Items Addressed This Visit      Other   Controlled substance agreement signed    Document reviewed with pt, questions were answered. Pt agreeable to our policies.       Generalized anxiety disorder - Primary    Psychiatry referral is in process, will bridge her medications until she can be seen with them. Restart her most successful previous dosing (1 mg TID PRN). Precautions discussed at length. Continue prozac. Hoping her sleep will improve once her anxiety and panic episodes are under better control.           Follow up plan: Return in about 4 weeks (around 04/09/2017) for Depression, anxiety.

## 2017-03-13 DIAGNOSIS — F321 Major depressive disorder, single episode, moderate: Secondary | ICD-10-CM | POA: Insufficient documentation

## 2017-03-13 NOTE — Assessment & Plan Note (Signed)
Document reviewed with pt, questions were answered. Pt agreeable to our policies.

## 2017-03-13 NOTE — Assessment & Plan Note (Signed)
Psychiatry referral is in process, will bridge her medications until she can be seen with them. Restart her most successful previous dosing (1 mg TID PRN). Precautions discussed at length. Continue prozac. Hoping her sleep will improve once her anxiety and panic episodes are under better control.

## 2017-03-13 NOTE — Patient Instructions (Signed)
Follow up in 1 month   

## 2017-03-13 NOTE — Assessment & Plan Note (Signed)
Will start her on Prozac and monitor closely. Psychiatry referral placed. Discussed risks and benefits, pt understanding of plan.

## 2017-03-13 NOTE — Patient Instructions (Signed)
Follow up for controlled substance agreement

## 2017-03-13 NOTE — Assessment & Plan Note (Addendum)
Discussed office policy of no controlled substances on first visit. Pt agreeable to come back and review our controlled substance agreement. I will restart her xanax once signed and bridge her until she can get into the Psychiatrist. Precautions reviewed in detail.

## 2017-04-11 ENCOUNTER — Encounter: Payer: Self-pay | Admitting: Family Medicine

## 2017-04-11 ENCOUNTER — Ambulatory Visit (INDEPENDENT_AMBULATORY_CARE_PROVIDER_SITE_OTHER): Payer: Medicaid Other | Admitting: Family Medicine

## 2017-04-11 VITALS — BP 136/82 | HR 95 | Temp 99.5°F | Wt 199.0 lb

## 2017-04-11 DIAGNOSIS — F411 Generalized anxiety disorder: Secondary | ICD-10-CM

## 2017-04-11 DIAGNOSIS — F321 Major depressive disorder, single episode, moderate: Secondary | ICD-10-CM

## 2017-04-11 DIAGNOSIS — R635 Abnormal weight gain: Secondary | ICD-10-CM

## 2017-04-11 MED ORDER — ALPRAZOLAM 1 MG PO TABS: 1.0000 mg | ORAL_TABLET | Freq: Three times a day (TID) | ORAL | 0 refills | Status: DC | PRN

## 2017-04-11 MED ORDER — FLUOXETINE HCL 40 MG PO CAPS: 40.0000 mg | ORAL_CAPSULE | Freq: Every day | ORAL | 1 refills | Status: DC

## 2017-04-11 NOTE — Patient Instructions (Signed)
Paleo [email or web address]  is an instagram account that I like to follow

## 2017-04-11 NOTE — Assessment & Plan Note (Signed)
Significant improvement with low dose prozac, will increase to 40 mg and check back in 1 month

## 2017-04-11 NOTE — Progress Notes (Signed)
BP 136/82   Pulse 95   Temp 99.5 F (37.5 C)   Wt 199 lb (90.3 kg)   LMP 03/12/2017 (Approximate)   SpO2 95%   BMI 32.61 kg/m    Subjective:    Patient ID: Caitlin Reid, female    DOB: 1981/02/25, 36 y.o.   MRN: [id number]  HPI: SAUL DORSI is a 36 y.o. female  Chief Complaint  Patient presents with  . Anxiety    doing alot better, husband is noticing a big difference. Still having issues with sleeping.   . Depression   Patient presents for depression and anxiety f/u. Was restarted on TID xanax and started initially on prozac last month. Notes a massive improvement on this regimen. Moods are greatly improved, as are energy levels and desire to get out and do things. Significant other notes a 180 degree turnaround. Still having some trouble sleeping, but it has improved.   Only other concern today is weight gain over the past year - was 154 lb and is up over 40 lb. Has had a really stressful past year with moving [place] the state, multiple deaths in the family, and being off her medications for over 4 months recently. Moods have been at their lowest point over the last year. Started the past month watching loosely what she eats and doing some cardio several times weekly.   Past Medical History:  Diagnosis Date  . ADHD (attention deficit hyperactivity disorder)   . Allergy   . Anxiety   . Foot pain, left   . Fracture of coccyx (HCC)   . Heel spur left  . Insomnia   . Kidney stones   . PID (acute pelvic inflammatory disease)   . Pleurisy    Patient reports that she has had this twice  . PTSD (post-traumatic stress disorder)    Social History   Social History  . Marital status: Single    Spouse name: N/A  . Number of children: N/A  . Years of education: N/A   Occupational History  . Not on file.   Social History Main Topics  . Smoking status: Current Every Day Smoker    Packs/day: 0.33    Types: Cigarettes  . Smokeless tobacco: Never Used  .  Alcohol use No  . Drug use: No  . Sexual activity: Not on file   Other Topics Concern  . Not on file   Social History Narrative  . No narrative on file   Relevant past medical, surgical, family and social history reviewed and updated as indicated. Interim medical history since our last visit reviewed. Allergies and medications reviewed and updated.  Review of Systems  Constitutional: Positive for unexpected weight change.  HENT: Negative.   Respiratory: Negative.   Cardiovascular: Negative.   Gastrointestinal: Negative.   Musculoskeletal: Negative.   Neurological: Negative.   Psychiatric/Behavioral: Positive for sleep disturbance.   Per HPI unless specifically indicated above     Objective:    BP 136/82   Pulse 95   Temp 99.5 F (37.5 C)   Wt 199 lb (90.3 kg)   LMP 03/12/2017 (Approximate)   SpO2 95%   BMI 32.61 kg/m   Wt Readings from Last 3 Encounters:  04/11/17 199 lb (90.3 kg)  03/12/17 202 lb (91.6 kg)  03/11/17 202 lb 12.8 oz (92 kg)    Physical Exam  Constitutional: She is oriented to person, place, and time. She appears well-developed and well-nourished. No distress.  HENT:  Head:  Atraumatic.  Eyes: Conjunctivae are normal. Pupils are equal, round, and reactive to light.  Neck: Normal range of motion. Neck supple.  Cardiovascular: Normal rate and normal heart sounds.   Pulmonary/Chest: Effort normal and breath sounds normal. No respiratory distress.  Musculoskeletal: Normal range of motion.  Neurological: She is alert and oriented to person, place, and time.  Skin: Skin is warm and dry.  Psychiatric: She has a normal mood and affect. Her behavior is normal.  Nursing note and vitals reviewed.     Assessment & Plan:   Problem List Items Addressed This Visit      Other   Generalized anxiety disorder - Primary    Stable on current regimen, continue TID xanax prn      Depression, major, single episode, moderate (HCC)    Significant improvement  with low dose prozac, will increase to 40 mg and check back in 1 month      Relevant Medications   FLUoxetine (PROZAC) 40 MG capsule   ALPRAZolam (XANAX) 1 MG tablet    Other Visit Diagnoses    Weight gain       Discussed likely impact of depressed moods on lifestyle factors and weight, long talk about good strategies to modify lifestyle and assist with weight loss    Discussed paleo diet and HIIT workouts - pt enthusiastically agreeable to trying these. Will continue to monitor progress and encourage pt along the way. Suspect as her moods continue to improve her weight and energy levels will follow suit.    Follow up plan: Return in about 4 weeks (around 05/09/2017) for Depression f/u.

## 2017-04-11 NOTE — Assessment & Plan Note (Signed)
Stable on current regimen, continue TID xanax prn

## 2017-04-17 ENCOUNTER — Telehealth: Payer: Self-pay | Admitting: Family Medicine

## 2017-04-17 ENCOUNTER — Other Ambulatory Visit: Payer: Self-pay | Admitting: Family Medicine

## 2017-04-17 MED ORDER — HYDROXYZINE HCL 50 MG PO TABS: 50.0000 mg | ORAL_TABLET | Freq: Three times a day (TID) | ORAL | 0 refills | Status: DC | PRN

## 2017-04-17 MED ORDER — FLUOXETINE HCL 40 MG PO CAPS: 40.0000 mg | ORAL_CAPSULE | Freq: Every day | ORAL | 0 refills | Status: DC

## 2017-04-17 NOTE — Telephone Encounter (Signed)
Pt came in to office and stated that she had forgot her medication at her parents house and she wasn't going to be able to get it because they were gone to [place] for the month.

## 2017-04-17 NOTE — Telephone Encounter (Signed)
Unable to refill xanax as it's controlled, but prozac refill sent to pharmacy for her to pick up. Will send hydroxyzine over to help with her anxiety until she can get access to her medications again

## 2017-05-09 ENCOUNTER — Ambulatory Visit (INDEPENDENT_AMBULATORY_CARE_PROVIDER_SITE_OTHER): Payer: Medicaid Other | Admitting: Family Medicine

## 2017-05-09 ENCOUNTER — Encounter: Payer: Self-pay | Admitting: Family Medicine

## 2017-05-09 VITALS — BP 121/80 | HR 94 | Wt 205.0 lb

## 2017-05-09 DIAGNOSIS — R059 Cough, unspecified: Secondary | ICD-10-CM

## 2017-05-09 DIAGNOSIS — R05 Cough: Secondary | ICD-10-CM

## 2017-05-09 DIAGNOSIS — Z720 Tobacco use: Secondary | ICD-10-CM | POA: Insufficient documentation

## 2017-05-09 DIAGNOSIS — F321 Major depressive disorder, single episode, moderate: Secondary | ICD-10-CM | POA: Diagnosis not present

## 2017-05-09 DIAGNOSIS — F411 Generalized anxiety disorder: Secondary | ICD-10-CM

## 2017-05-09 MED ORDER — ALBUTEROL SULFATE HFA 108 (90 BASE) MCG/ACT IN AERS: 2.0000 | INHALATION_SPRAY | Freq: Four times a day (QID) | RESPIRATORY_TRACT | 1 refills | Status: DC | PRN

## 2017-05-09 MED ORDER — FLUOXETINE HCL 40 MG PO CAPS: 40.0000 mg | ORAL_CAPSULE | Freq: Every day | ORAL | 6 refills | Status: DC

## 2017-05-09 MED ORDER — ALPRAZOLAM 1 MG PO TABS: 1.0000 mg | ORAL_TABLET | Freq: Three times a day (TID) | ORAL | 0 refills | Status: DC | PRN

## 2017-05-09 NOTE — Assessment & Plan Note (Signed)
Refilled xanax today, continue using TID prn. Continue regular counseling sessions.

## 2017-05-09 NOTE — Assessment & Plan Note (Signed)
Doing very well on 40 mg prozac, continue current regimen

## 2017-05-09 NOTE — Progress Notes (Addendum)
BP 121/80   Pulse 94   Wt 205 lb (93 kg)   SpO2 94%   BMI 33.59 kg/m    Subjective:    Patient ID: Caitlin Reid, female    DOB: 1981-04-15, 35 y.o.   MRN: [id number]  HPI: Caitlin Reid is a 36 y.o. female  Chief Complaint  Patient presents with  . Follow-up  . Depression   Patient presents for depression and anxiety f/u. Has been out of her xanax for 3 weeks now because she left her script at her parent's house out of town and has not had access. Doing very well on the increased dose of prozac, feels like her moods have improved significantly. Denies SI/HI, anhedonia, sleep, or appetite issues.   Wanting very much to quit smoking. Has not tried any methods at this time, but boyfriend is ready to quit as well.   Also notes a new cough the past few days that has turned into some chest tightness and mild wheezing. Denies fever, chills, SOB, congestion.   Depression screen [facility] 2/9 05/09/2017 04/11/2017 03/11/2017 04/10/2016  Decreased Interest 1 1 3  0  Down, Depressed, Hopeless 0 0 3 0  PHQ - 2 Score 1 1 6  0  Altered sleeping - 3 3 -  Tired, decreased energy - - 3 -  Change in appetite - - 2 -  Feeling bad or failure about yourself  - - 3 -  Trouble concentrating - - 0 -  Moving slowly or fidgety/restless - - 0 -  Suicidal thoughts - - 0 -  PHQ-9 Score - 4 17 -    Past Medical History:  Diagnosis Date  . ADHD (attention deficit hyperactivity disorder)   . Allergy   . Anxiety   . Foot pain, left   . Fracture of coccyx (HCC)   . Heel spur left  . Insomnia   . Kidney stones   . PID (acute pelvic inflammatory disease)   . Pleurisy    Patient reports that she has had this twice  . PTSD (post-traumatic stress disorder)    Social History   Social History  . Marital status: Single    Spouse name: N/A  . Number of children: N/A  . Years of education: N/A   Occupational History  . Not on file.   Social History Main Topics  . Smoking status: Current Every  Day Smoker    Packs/day: 0.33    Types: Cigarettes  . Smokeless tobacco: Never Used  . Alcohol use No  . Drug use: No  . Sexual activity: Not on file   Other Topics Concern  . Not on file   Social History Narrative  . No narrative on file   Relevant past medical, surgical, family and social history reviewed and updated as indicated. Interim medical history since our last visit reviewed. Allergies and medications reviewed and updated.  Review of Systems  Constitutional: Negative.   HENT: Negative.   Respiratory: Positive for cough, chest tightness and wheezing.   Cardiovascular: Negative.   Gastrointestinal: Negative.   Genitourinary: Negative.   Musculoskeletal: Negative.   Neurological: Negative.   Psychiatric/Behavioral: The patient is nervous/anxious.    Per HPI unless specifically indicated above     Objective:    BP 121/80   Pulse 94   Wt 205 lb (93 kg)   SpO2 94%   BMI 33.59 kg/m   Wt Readings from Last 3 Encounters:  05/09/17 205 lb (93 kg)  04/11/17  199 lb (90.3 kg)  03/12/17 202 lb (91.6 kg)    Physical Exam  Constitutional: She is oriented to person, place, and time. She appears well-developed and well-nourished.  HENT:  Head: Atraumatic.  Eyes: Conjunctivae are normal. Pupils are equal, round, and reactive to light. No scleral icterus.  Neck: Normal range of motion. Neck supple.  Cardiovascular: Normal rate, regular rhythm and normal heart sounds.   Pulmonary/Chest: Effort normal. No respiratory distress. She has wheezes (mild).  Musculoskeletal: Normal range of motion.  Neurological: She is alert and oriented to person, place, and time.  Skin: Skin is warm and dry.  Psychiatric: She has a normal mood and affect. Her behavior is normal.  Nursing note and vitals reviewed.     Assessment & Plan:   Problem List Items Addressed This Visit      Other   Generalized anxiety disorder    Refilled xanax today, continue using TID prn. Continue regular  counseling sessions.       Depression, major, single episode, moderate (HCC) - Primary    Doing very well on 40 mg prozac, continue current regimen      Relevant Medications   ALPRAZolam (XANAX) 1 MG tablet   FLUoxetine (PROZAC) 40 MG capsule   Tobacco use    Not a good candidate for zyban or chantix. Discussed gum/patches, free classes at [facility], significant other support, habit replacement strategies. Pt motivated and wanting very much to quit.        Other Visit Diagnoses    Cough       With mild wheezing. Discussed OTC cough remedies, will send some albuterol to use as needed. F/u if sxs worsening or not improving       Follow up plan: Return in about 6 months (around 11/08/2017) for Depression, anxiety f/u. Will schedule CPE sometime between now and then. Discussed that Medicaid does not cover these on the same day.

## 2017-05-09 NOTE — Assessment & Plan Note (Signed)
Not a good candidate for zyban or chantix. Discussed gum/patches, free classes at [facility], significant other support, habit replacement strategies. Pt motivated and wanting very much to quit.

## 2017-05-13 ENCOUNTER — Ambulatory Visit: Payer: Medicaid Other | Admitting: Family Medicine

## 2017-06-02 ENCOUNTER — Telehealth: Payer: Self-pay | Admitting: Family Medicine

## 2017-06-02 NOTE — Telephone Encounter (Signed)
Noted. Ok per provider.

## 2017-06-02 NOTE — Telephone Encounter (Signed)
Pt came in and stated she would like to pick her 28 day prescription for alprazolam refill the morning of 06/06/17 instead of after 2PM.

## 2017-06-06 ENCOUNTER — Other Ambulatory Visit: Payer: Self-pay | Admitting: Family Medicine

## 2017-06-06 MED ORDER — ALPRAZOLAM 1 MG PO TABS: 1.0000 mg | ORAL_TABLET | Freq: Three times a day (TID) | ORAL | 0 refills | Status: DC | PRN

## 2017-06-15 IMAGING — CR DG ANKLE COMPLETE 3+V*L*
3 series · 3 of 3 positions shown · non-contrast
Comparison: None.

CLINICAL DATA: Status post fall with left foot and ankle pain.

EXAM:
LEFT ANKLE COMPLETE - 3+ VIEW

[ankle ap]
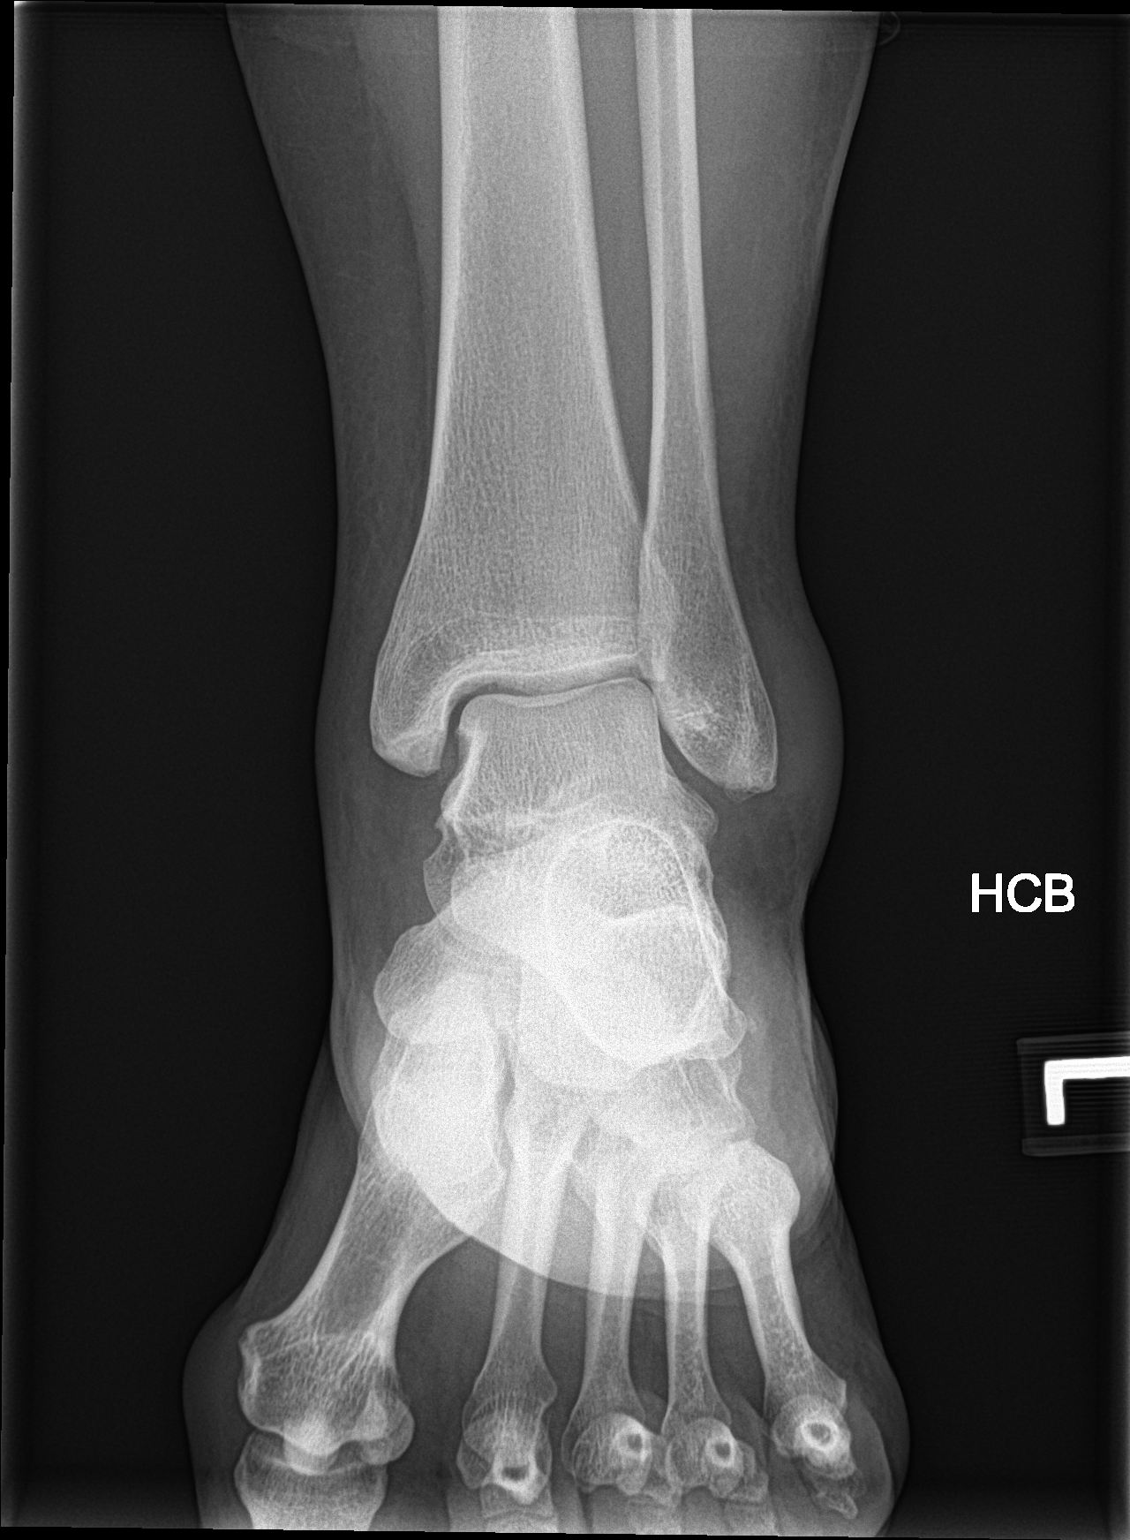

[ankle obl]
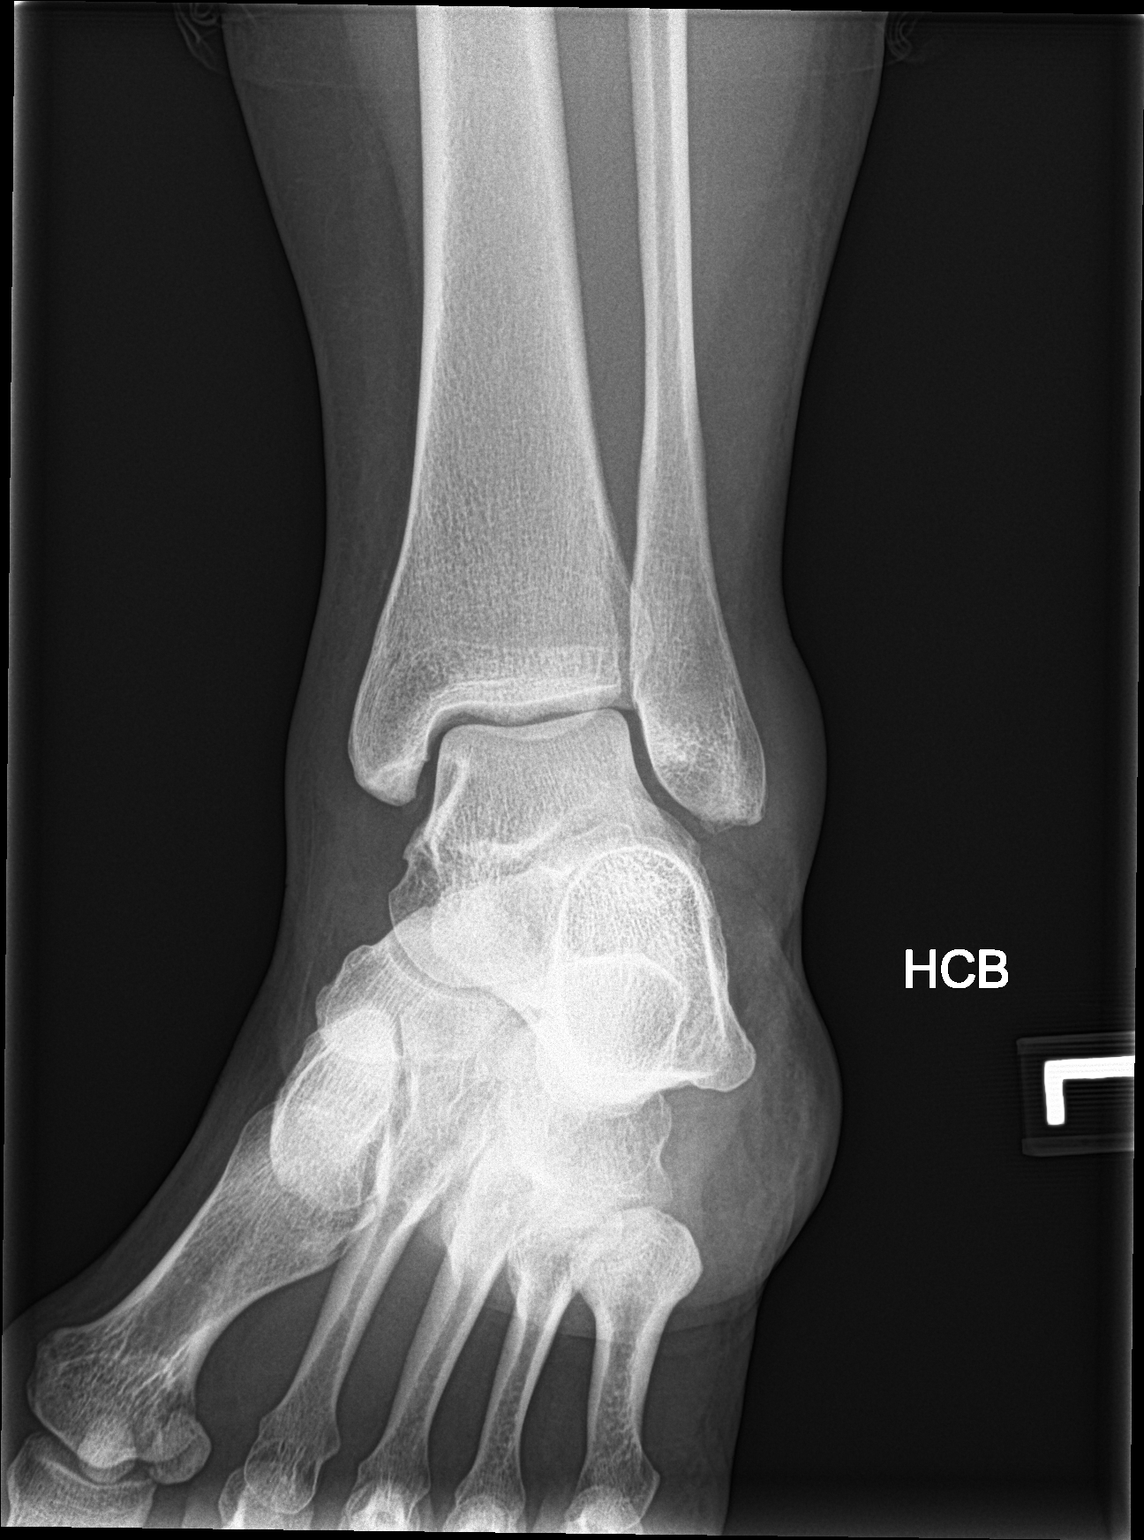

[ankle lat]
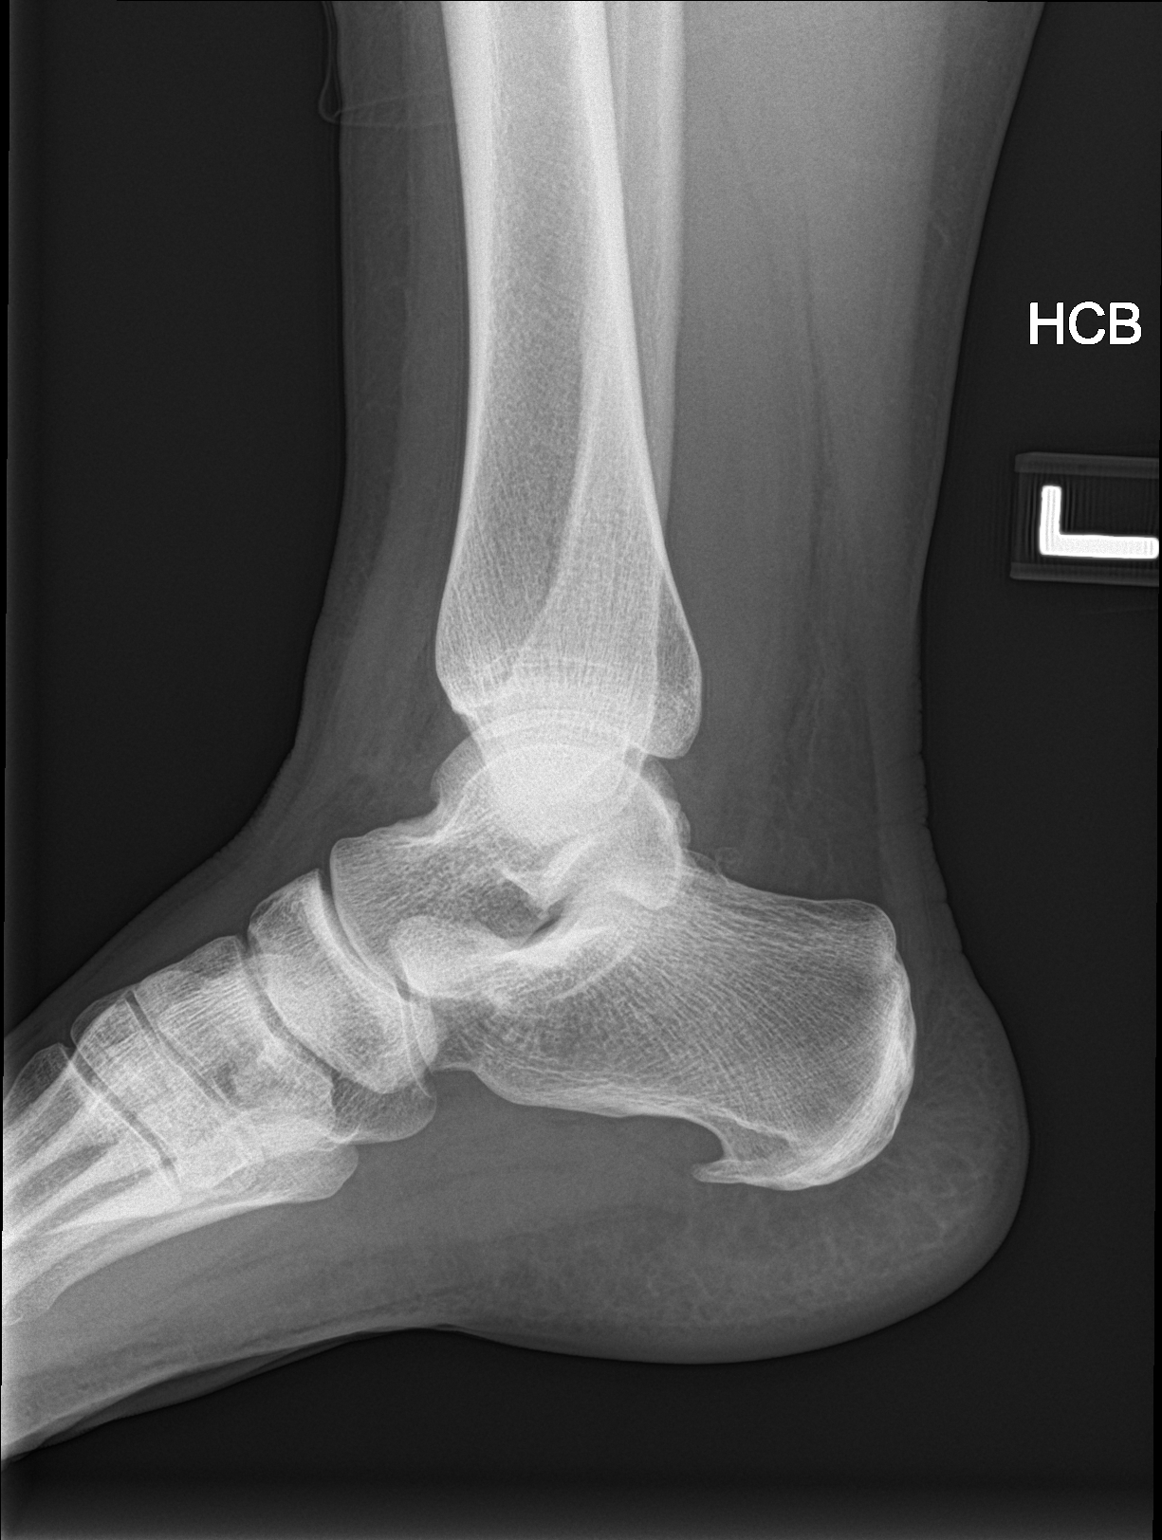

[3 of 3 positions shown; findings below may reference images not displayed]

FINDINGS: There is no evidence of fracture, dislocation, or joint effusion.
There is plantar calcaneal spur. Soft tissue swelling is noted in
the lateral ankle.
IMPRESSION: No acute fracture or dislocation. Soft tissue swelling lateral
ankle.

## 2017-06-16 ENCOUNTER — Other Ambulatory Visit: Payer: Self-pay | Admitting: Family Medicine

## 2017-06-16 MED ORDER — ALPRAZOLAM 1 MG PO TABS: 1.0000 mg | ORAL_TABLET | Freq: Three times a day (TID) | ORAL | 0 refills | Status: DC | PRN

## 2017-07-12 IMAGING — CT CT ABD-PELV W/ CM
1 of 2 series · 15 of 32 positions shown, 19 images · IV contrast (omnipaque)
Comparison: CT dated 09/10/2015

CLINICAL DATA: 34-year-old female with right lower quadrant
abdominal pain

EXAM:
CT ABDOMEN AND PELVIS WITH CONTRAST
TECHNIQUE: Multidetector CT imaging of the abdomen and pelvis was performed
using the standard protocol following bolus administration of
intravenous contrast.
CONTRAST:  100mL OMNIPAQUE IOHEXOL 300 MG/ML  SOLN

[Series 2: routine abd pel with · axial · 0.72mm/px · z∈[-494,-74]mm · 15 of 92 slices shown, 19 images]
[im 4/92  soft-tissue]
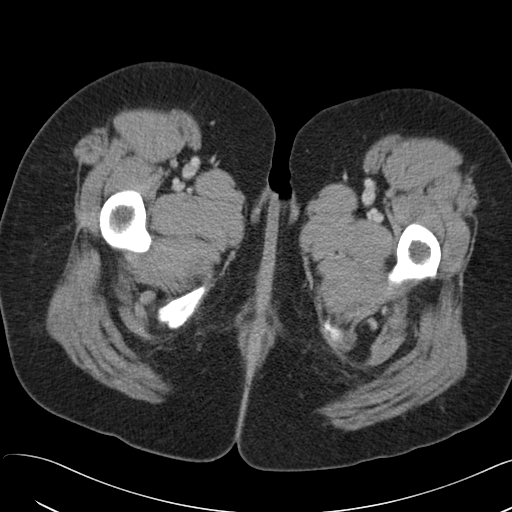
[im 4/92  bone]
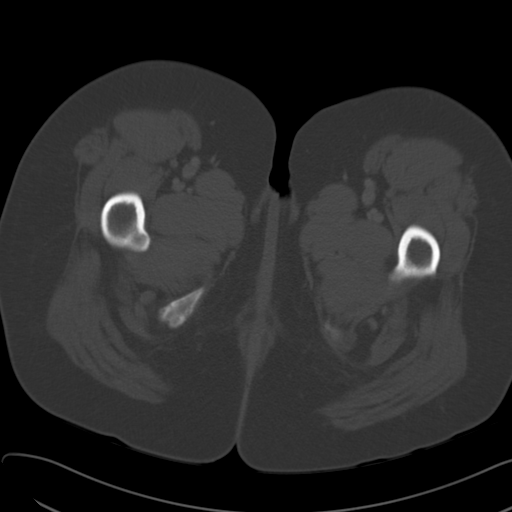
[im 12/92  soft-tissue]
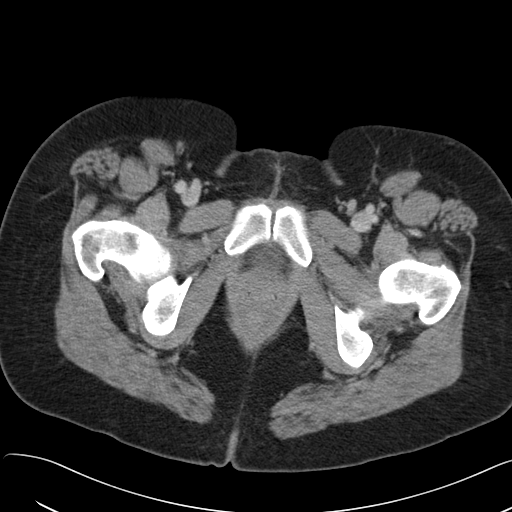
[im 19/92  soft-tissue]
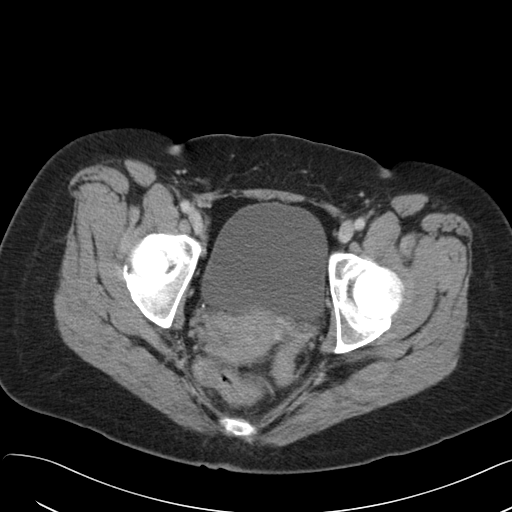
[im 27/92  soft-tissue]
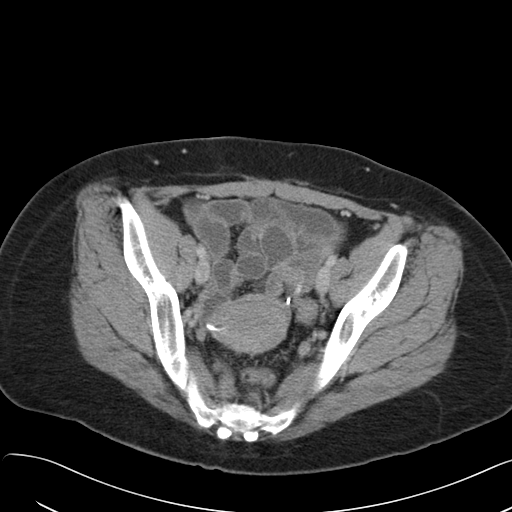
[im 31/92  soft-tissue]
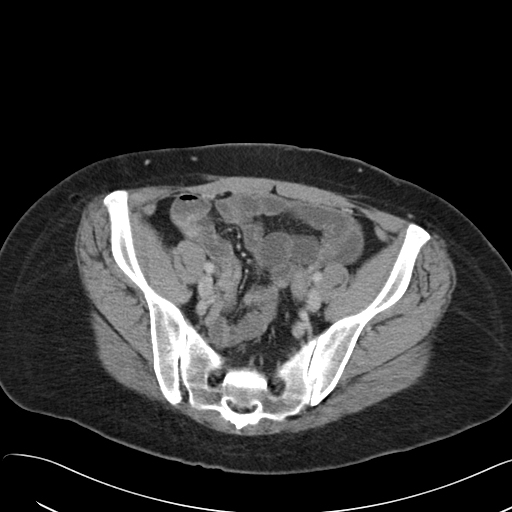
[im 38/92  soft-tissue]
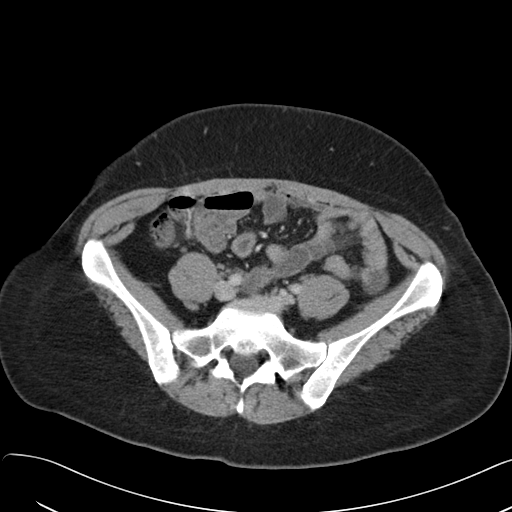
[im 46/92  soft-tissue]
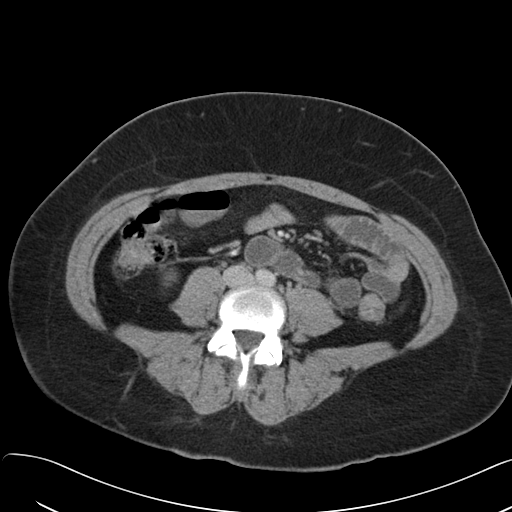
[im 54/92  soft-tissue]
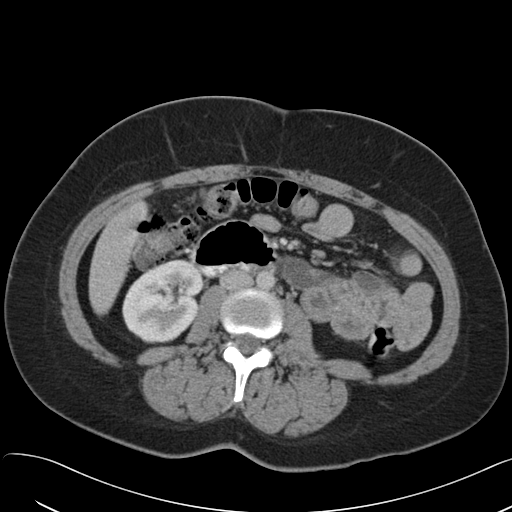
[im 61/92  soft-tissue]
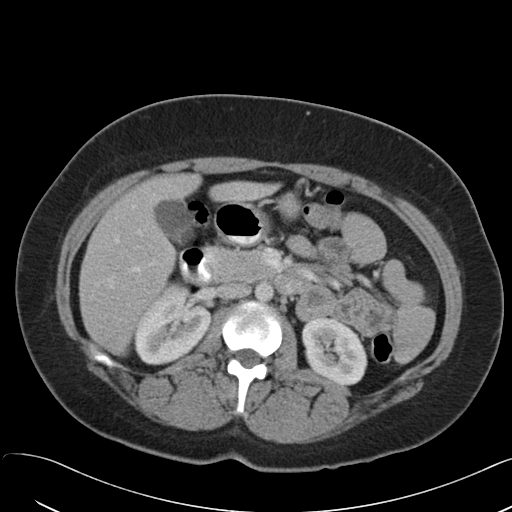
[im 61/92  bone]
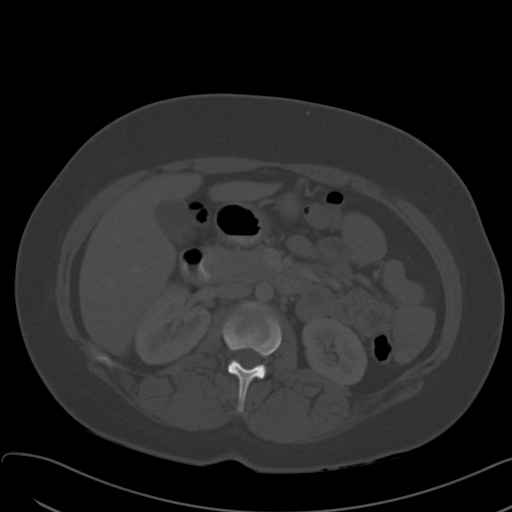
[im 65/92  soft-tissue]
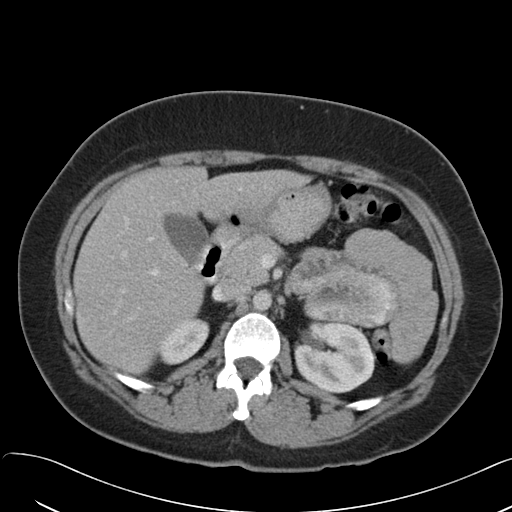
[im 73/92  soft-tissue]
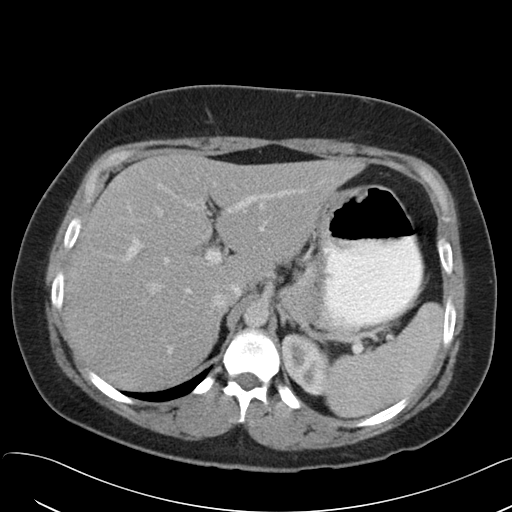
[im 76/92  lung]
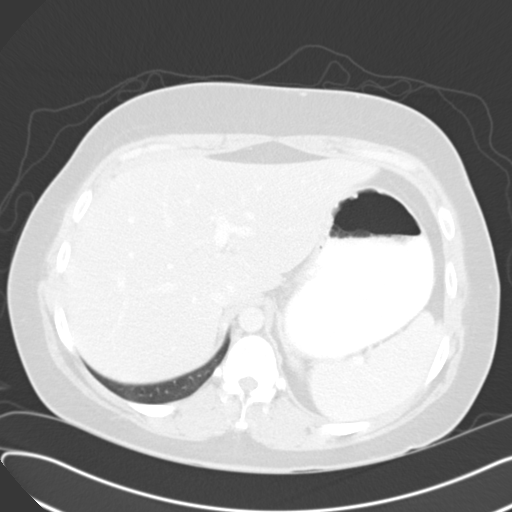
[im 80/92  soft-tissue]
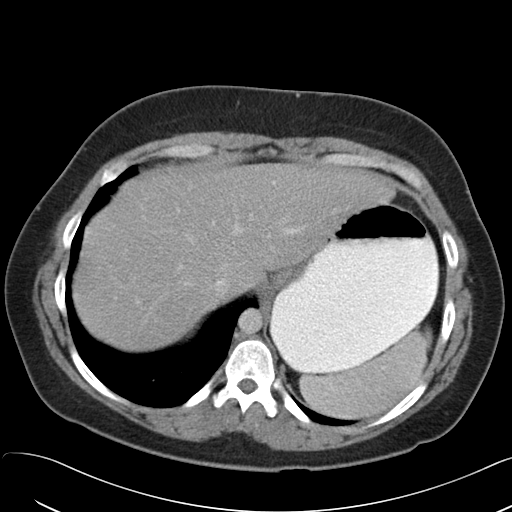
[im 80/92  lung]
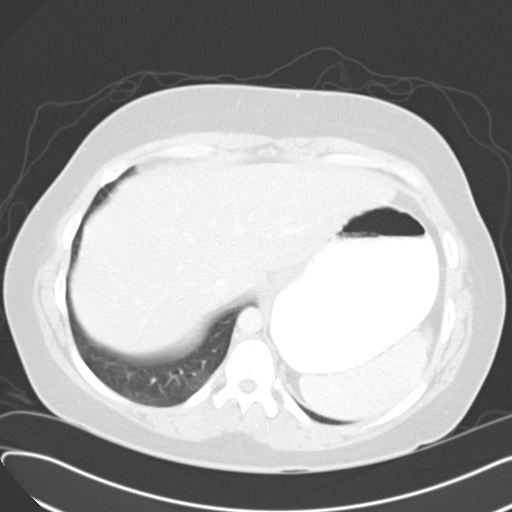
[im 84/92  lung]
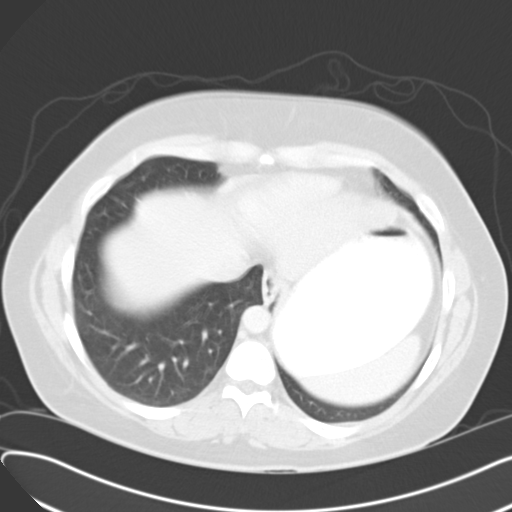
[im 88/92  soft-tissue]
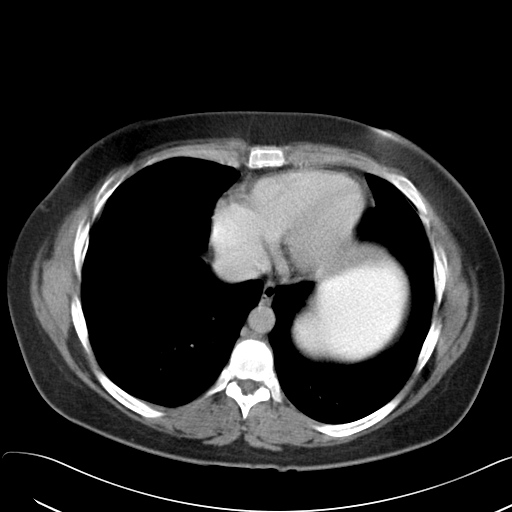
[im 88/92  lung]
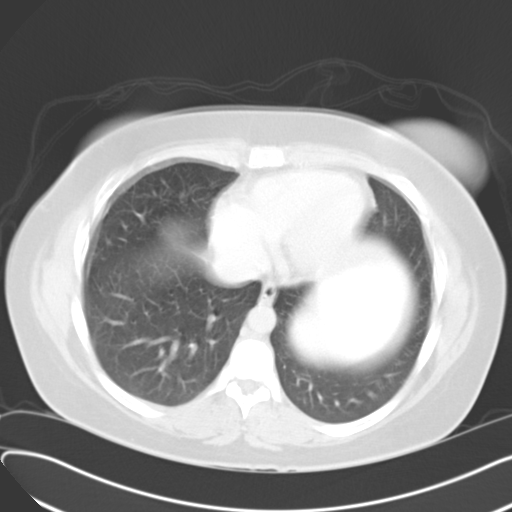

[15 of 32 positions shown; findings below may reference images not displayed]

FINDINGS: The visualized lung bases are clear. No intra-abdominal free air or
free fluid.

The liver, gallbladder, pancreas, spleen, adrenal glands, kidneys,
visualized ureters, and urinary bladder appear unremarkable. The
uterus is grossly unremarkable. Bilateral tubal occlusion devices
noted.

There is no evidence of bowel obstruction or inflammation. Normal
appendix.

The abdominal aorta and IVC appear patent. No portal venous gas
identified. There is no adenopathy. Small fat containing umbilical
hernia. The abdominal wall soft tissues appear unremarkable. The
osseous structures are intact.
IMPRESSION: No acute intra-abdominal pelvic pathology.

## 2017-07-31 ENCOUNTER — Other Ambulatory Visit: Payer: Self-pay | Admitting: Family Medicine

## 2017-07-31 ENCOUNTER — Telehealth: Payer: Self-pay | Admitting: Family Medicine

## 2017-07-31 MED ORDER — ALPRAZOLAM 1 MG PO TABS: 1.0000 mg | ORAL_TABLET | Freq: Three times a day (TID) | ORAL | 0 refills | Status: DC | PRN

## 2017-07-31 NOTE — Telephone Encounter (Signed)
Called and left patient a VM letting her know that her prescription was ready to be picked up.  
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
## 2017-08-17 ENCOUNTER — Emergency Department
Admission: EM | Admit: 2017-08-17 | Discharge: 2017-08-17 | Disposition: A | Discharge status: Home / Self Care | Payer: Medicaid Other | Attending: Emergency Medicine | Admitting: Emergency Medicine

## 2017-08-17 ENCOUNTER — Emergency Department: Payer: Medicaid Other

## 2017-08-17 DIAGNOSIS — Z79899 Other long term (current) drug therapy: Secondary | ICD-10-CM | POA: Insufficient documentation

## 2017-08-17 DIAGNOSIS — Z91410 Personal history of adult physical and sexual abuse: Secondary | ICD-10-CM | POA: Insufficient documentation

## 2017-08-17 DIAGNOSIS — S022XXA Fracture of nasal bones, initial encounter for closed fracture: Secondary | ICD-10-CM | POA: Diagnosis not present

## 2017-08-17 DIAGNOSIS — R110 Nausea: Secondary | ICD-10-CM | POA: Insufficient documentation

## 2017-08-17 DIAGNOSIS — S0990XA Unspecified injury of head, initial encounter: Secondary | ICD-10-CM | POA: Diagnosis present

## 2017-08-17 DIAGNOSIS — Y939 Activity, unspecified: Secondary | ICD-10-CM | POA: Diagnosis not present

## 2017-08-17 DIAGNOSIS — T7491XA Unspecified adult maltreatment, confirmed, initial encounter: Secondary | ICD-10-CM

## 2017-08-17 DIAGNOSIS — Y999 Unspecified external cause status: Secondary | ICD-10-CM | POA: Diagnosis not present

## 2017-08-17 DIAGNOSIS — Y929 Unspecified place or not applicable: Secondary | ICD-10-CM | POA: Insufficient documentation

## 2017-08-17 LAB — POCT PREGNANCY, URINE: PREG TEST UR: NEGATIVE

## 2017-08-17 MED ORDER — OXYCODONE-ACETAMINOPHEN 5-325 MG PO TABS
1.0000 | ORAL_TABLET | Freq: Once | ORAL | Status: AC
  Administered 2017-08-17: 1 via ORAL
  Filled 2017-08-17: qty 1

## 2017-08-17 MED ORDER — OXYCODONE-ACETAMINOPHEN 5-325 MG PO TABS: 1.0000 | ORAL_TABLET | Freq: Four times a day (QID) | ORAL | 0 refills | Status: DC | PRN

## 2017-08-17 MED ORDER — ONDANSETRON 4 MG PO TBDP
4.0000 mg | ORAL_TABLET | Freq: Once | ORAL | Status: AC
  Administered 2017-08-17: 4 mg via ORAL
  Filled 2017-08-17: qty 1
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
## 2017-08-17 NOTE — Discharge Instructions (Signed)
Follow-up with her primary care doctor if any continued problems or Dr. Elenore Rota who is on call for [facility]. Percocet as needed for pain. Take only as directed 1 every 6 hours as needed for pain. Apply ice to your face as needed for swelling.
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
## 2017-08-19 ENCOUNTER — Encounter: Payer: Self-pay | Admitting: Family Medicine

## 2017-08-19 ENCOUNTER — Ambulatory Visit (INDEPENDENT_AMBULATORY_CARE_PROVIDER_SITE_OTHER): Payer: Medicaid Other | Admitting: Family Medicine

## 2017-08-19 VITALS — BP 109/73 | HR 87 | Temp 98.9°F | Wt 220.0 lb

## 2017-08-19 DIAGNOSIS — S022XXD Fracture of nasal bones, subsequent encounter for fracture with routine healing: Secondary | ICD-10-CM

## 2017-08-19 DIAGNOSIS — G4700 Insomnia, unspecified: Secondary | ICD-10-CM | POA: Diagnosis not present

## 2017-08-19 DIAGNOSIS — F411 Generalized anxiety disorder: Secondary | ICD-10-CM

## 2017-08-19 DIAGNOSIS — R6884 Jaw pain: Secondary | ICD-10-CM

## 2017-08-19 MED ORDER — ALPRAZOLAM 2 MG PO TABS: 2.0000 mg | ORAL_TABLET | Freq: Two times a day (BID) | ORAL | 0 refills | Status: DC | PRN

## 2017-08-19 MED ORDER — ZOLPIDEM TARTRATE 10 MG PO TABS: 10.0000 mg | ORAL_TABLET | Freq: Every evening | ORAL | 0 refills | Status: DC | PRN

## 2017-08-19 MED ORDER — CYCLOBENZAPRINE HCL 5 MG PO TABS: 5.0000 mg | ORAL_TABLET | Freq: Three times a day (TID) | ORAL | 1 refills | Status: DC | PRN
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
## 2017-08-21 NOTE — Patient Instructions (Signed)
Follow up as needed, or in 3 months for regular follow up
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
## 2017-09-10 ENCOUNTER — Telehealth: Payer: Self-pay

## 2017-09-10 NOTE — Telephone Encounter (Signed)
Patient called to inquire when to pick up her xanax since the 28th falls on a Sunday. Spoke with Margit Hanks. Patient can pick up Xanax on October 29th (Monday). Pt confirmed and denies any questions at this time

## 2017-09-12 ENCOUNTER — Telehealth: Payer: Self-pay | Admitting: Family Medicine

## 2017-09-12 NOTE — Telephone Encounter (Signed)
Copied from CRM #[id number]. Topic: Quick Communication - See Telephone Encounter >> Sep 12, 2017 12:05 PM Arlyss Gandy, NT wrote: CRM for notification. See Telephone encounter for:  09/12/17. Patient is part of the 28 day cycle on getting her anxiety medicine filled. She states that falls on this Sunday and she only has a ride for today to come get this medicine. Can this be wrote today and her pick it up today?

## 2017-09-12 NOTE — Telephone Encounter (Signed)
LVM for patient to return phone call. Okay for PEC/Nurse Triage to give information.  Patient is on 28 day for her meds. Her meds are not due on the 28th of the month. Patient should have at least enough medication to last her till the 31st per Fleet Contras.

## 2017-09-12 NOTE — Telephone Encounter (Signed)
Please call and let her know that she won't be due for her xanax until the 31st - I had filled her script as a favor on 10/2 after she lost her normal medication so it needs to be a month from that date before she can resume her normal cycle. She should have plenty until then.

## 2017-09-15 ENCOUNTER — Other Ambulatory Visit: Payer: Self-pay | Admitting: Family Medicine

## 2017-09-15 MED ORDER — ALPRAZOLAM 1 MG PO TABS: 1.0000 mg | ORAL_TABLET | Freq: Three times a day (TID) | ORAL | 0 refills | Status: DC | PRN

## 2017-09-15 NOTE — Telephone Encounter (Signed)
Patient came into office.  Fleet Contras gave a refill but wrote that she could not get it filled till 09/17/2017.  Patient stated today was the only day she had a ride.   Routing to [place] in case anything further needs to be documented regarding rx.

## 2017-10-17 ENCOUNTER — Telehealth: Payer: Self-pay | Admitting: Family Medicine

## 2017-10-17 MED ORDER — ALPRAZOLAM 1 MG PO TABS: 1.0000 mg | ORAL_TABLET | Freq: Three times a day (TID) | ORAL | 0 refills | Status: DC | PRN

## 2017-10-17 NOTE — Telephone Encounter (Signed)
Printed and ready

## 2017-10-17 NOTE — Telephone Encounter (Signed)
-----   Message from Sharol Given sent at 10/17/2017 10:58 AM EST ----- Regarding: 28 day refill Pt is due for 28 day medication. Once printed if you get it to me I will ut it in the book.

## 2017-10-22 ENCOUNTER — Encounter: Payer: Medicaid Other | Admitting: Family Medicine

## 2017-10-24 ENCOUNTER — Other Ambulatory Visit: Payer: Self-pay | Admitting: Family Medicine

## 2017-11-19 ENCOUNTER — Telehealth: Payer: Self-pay | Admitting: Family Medicine

## 2017-11-19 ENCOUNTER — Other Ambulatory Visit: Payer: Self-pay | Admitting: Family Medicine

## 2017-11-19 MED ORDER — ALPRAZOLAM 1 MG PO TABS: 1.0000 mg | ORAL_TABLET | Freq: Three times a day (TID) | ORAL | 0 refills | Status: DC | PRN

## 2017-11-19 NOTE — Telephone Encounter (Signed)
Needs appt, no-showed her last OV so will not auto-renew this medication

## 2017-11-19 NOTE — Telephone Encounter (Signed)
Refilled 6 to get her through until Friday.

## 2017-11-19 NOTE — Telephone Encounter (Signed)
Patient said she is coming today at 2:00pm to pick up her prescription.

## 2017-11-19 NOTE — Telephone Encounter (Signed)
Controlled substance 

## 2017-11-19 NOTE — Telephone Encounter (Signed)
Patient would like enough pills until her appointment on 1/4 @815   Please advise  Thanks  [phone]

## 2017-11-19 NOTE — Telephone Encounter (Signed)
Please advise pt asking for enough medication to get to 1/4

## 2017-11-19 NOTE — Telephone Encounter (Signed)
Please see request for refill on Alprazolam.

## 2017-11-19 NOTE — Telephone Encounter (Signed)
Copied from CRM [id number]. Topic: Quick Communication - See Telephone Encounter >> Nov 19, 2017 11:29 AM Floria Raveling A wrote: CRM for notification. See Telephone encounter for: pt called in and needs a refill on her ALPRAZolam Prudy Feeler) 1 MG tablet [[id number]] Pharmacy - [facility]   11/19/17.

## 2017-11-21 ENCOUNTER — Ambulatory Visit (INDEPENDENT_AMBULATORY_CARE_PROVIDER_SITE_OTHER): Payer: Medicaid Other | Admitting: Family Medicine

## 2017-11-21 ENCOUNTER — Encounter: Payer: Self-pay | Admitting: Family Medicine

## 2017-11-21 VITALS — BP 125/75 | HR 89 | Temp 98.2°F | Wt 201.4 lb

## 2017-11-21 DIAGNOSIS — M25541 Pain in joints of right hand: Secondary | ICD-10-CM | POA: Diagnosis not present

## 2017-11-21 DIAGNOSIS — F411 Generalized anxiety disorder: Secondary | ICD-10-CM | POA: Diagnosis not present

## 2017-11-21 DIAGNOSIS — M25542 Pain in joints of left hand: Secondary | ICD-10-CM

## 2017-11-21 DIAGNOSIS — M7989 Other specified soft tissue disorders: Secondary | ICD-10-CM | POA: Diagnosis not present

## 2017-11-21 MED ORDER — POTASSIUM CHLORIDE CRYS ER 20 MEQ PO TBCR: 20.0000 meq | EXTENDED_RELEASE_TABLET | Freq: Every day | ORAL | 3 refills | Status: DC

## 2017-11-21 MED ORDER — FUROSEMIDE 20 MG PO TABS: 20.0000 mg | ORAL_TABLET | Freq: Every day | ORAL | 3 refills | Status: DC

## 2017-11-21 MED ORDER — ALPRAZOLAM 1 MG PO TABS: 1.0000 mg | ORAL_TABLET | Freq: Three times a day (TID) | ORAL | 0 refills | Status: DC | PRN

## 2017-11-21 NOTE — Progress Notes (Signed)
BP 125/75 (BP Location: Right Arm, Patient Position: Sitting, Cuff Size: Normal)   Pulse 89   Temp 98.2 F (36.8 C) (Oral)   Wt 201 lb 6.4 oz (91.4 kg)   SpO2 100%   BMI 38.05 kg/m    Subjective:    Patient ID: Caitlin Reid, female    DOB: 1981/05/29, 37 y.o.   MRN: [id number]  HPI: Caitlin Reid is a 37 y.o. female  Chief Complaint  Patient presents with  . Anxiety    Medication refill for Xanax.  . Edema    Legs will swell. Was in ER and given Lasix and patient stated that helped.   Pt here today for anxiety f/u. Taking 3 xanax daily on a typical day which does take the edge off for her. States the holidays were incredibly stressful due to some major family issues going on. Has not had time to establish with a counselor yet. Denies SI/HI.   Also notes long hx of hand and knee pain and swelling, with the leg swelling going all the way down to her feet. Went to the ER for this in 2016 and was given lasix and potassium with good relief. Notes her joints get very stiff and sometimes turn red, especially her hands. Seems much worse after sitting still for a while. No known hx of arthritis or autoimmune dz.   GAD 7 : Generalized Anxiety Score 11/21/2017 08/19/2017 04/11/2017 03/12/2017  Nervous, Anxious, on Edge 0 3 0 3  Control/stop worrying 0 3 0 3  Worry too much - different things 1 3 0 3  Trouble relaxing 0 '3 1 3  '$ Restless 0 3 0 1  Easily annoyed or irritable 0 3 0 3  Afraid - awful might happen 0 3 0 2  Total GAD 7 Score '1 21 1 18  '$ Anxiety Difficulty Not difficult at all - Not difficult at all Extremely difficult     Past Medical History:  Diagnosis Date  . ADHD (attention deficit hyperactivity disorder)   . Allergy   . Anxiety   . Foot pain, left   . Fracture of coccyx ([place])   . Heel spur left  . Insomnia   . Kidney stones   . PID (acute pelvic inflammatory disease)   . Pleurisy    Patient reports that she has had this twice  . PTSD (post-traumatic  stress disorder)    Social History   Socioeconomic History  . Marital status: Single    Spouse name: Not on file  . Number of children: Not on file  . Years of education: Not on file  . Highest education level: Not on file  Social Needs  . Financial resource strain: Not on file  . Food insecurity - worry: Not on file  . Food insecurity - inability: Not on file  . Transportation needs - medical: Not on file  . Transportation needs - non-medical: Not on file  Occupational History  . Not on file  Tobacco Use  . Smoking status: Current Every Day Smoker    Packs/day: 0.25    Types: Cigarettes  . Smokeless tobacco: Never Used  Substance and Sexual Activity  . Alcohol use: No  . Drug use: No  . Sexual activity: Not on file  Other Topics Concern  . Not on file  Social History Narrative  . Not on file    Relevant past medical, surgical, family and social history reviewed and updated as indicated. Interim medical history since  our last visit reviewed. Allergies and medications reviewed and updated.  Review of Systems  Constitutional: Negative.   HENT: Negative.   Eyes: Negative.   Respiratory: Negative.   Cardiovascular: Positive for leg swelling.  Gastrointestinal: Negative.   Genitourinary: Negative.   Musculoskeletal: Positive for arthralgias and joint swelling.  Skin: Positive for color change.  Neurological: Negative.   Psychiatric/Behavioral: The patient is nervous/anxious.    Per HPI unless specifically indicated above     Objective:    BP 125/75 (BP Location: Right Arm, Patient Position: Sitting, Cuff Size: Normal)   Pulse 89   Temp 98.2 F (36.8 C) (Oral)   Wt 201 lb 6.4 oz (91.4 kg)   SpO2 100%   BMI 38.05 kg/m   Wt Readings from Last 3 Encounters:  11/21/17 201 lb 6.4 oz (91.4 kg)  08/19/17 220 lb (99.8 kg)  08/17/17 200 lb (90.7 kg)    Physical Exam  Constitutional: She is oriented to person, place, and time. She appears well-developed and  well-nourished. No distress.  HENT:  Head: Atraumatic.  Eyes: Conjunctivae are normal. No scleral icterus.  Neck: Normal range of motion. Neck supple.  Cardiovascular: Normal rate and normal heart sounds.  Pulmonary/Chest: Effort normal and breath sounds normal. No respiratory distress.  Musculoskeletal: Normal range of motion. She exhibits edema (b/l hands and lower legs below knee). She exhibits no tenderness.  Neurological: She is alert and oriented to person, place, and time.  Skin: Skin is warm and dry. There is erythema (mild erythema b/l hands).  Psychiatric: She has a normal mood and affect. Her behavior is normal.  Nursing note and vitals reviewed.  Results for orders placed or performed in visit on 11/21/17  Rheumatoid Factor  Result Value Ref Range   Rhuematoid fact SerPl-aCnc <10.0 0.0 - 13.9 IU/mL  ANA w/Reflex  Result Value Ref Range   Anit Nuclear Antibody(ANA) Negative Negative  Sed Rate (ESR)  Result Value Ref Range   Sed Rate 3 0 - 32 mm/hr      Assessment & Plan:   Problem List Items Addressed This Visit      Other   Generalized anxiety disorder    Relatively stable on xanax TID. Continue current regimen. Strongly recommended adding regular counseling given her significant trauma and family disturbances recently      Relevant Medications   ALPRAZolam (XANAX) 1 MG tablet    Other Visit Diagnoses    Arthralgia of both hands    -  Primary   Will r/o basic inflammatory causes, discussed epsom soaks, ibuprofen or tylenol prn, stretches   Relevant Orders   Rheumatoid Factor (Completed)   ANA w/Reflex (Completed)   Sed Rate (ESR) (Completed)   Leg swelling       Will trial another round of lasix and potassium, recheck BMP at upcoming visit. Discussed calling the office with dizzy spells, dehydration, etc       Follow up plan: Return in about 3 months (around 02/19/2018) for Anxiety, leg swelling.

## 2017-11-22 LAB — RHEUMATOID FACTOR: Rhuematoid fact SerPl-aCnc: <10 IU/mL (ref 0.0–13.9)

## 2017-11-22 LAB — ANA W/REFLEX: Anti Nuclear Antibody(ANA): NEGATIVE

## 2017-11-22 LAB — SEDIMENTATION RATE: Sed Rate: 3 mm/hr (ref 0–32)

## 2017-11-23 NOTE — Patient Instructions (Signed)
Follow up in 3 months, sooner if needed.

## 2017-11-23 NOTE — Assessment & Plan Note (Signed)
Relatively stable on xanax TID. Continue current regimen. Strongly recommended adding regular counseling given her significant trauma and family disturbances recently

## 2017-12-01 ENCOUNTER — Encounter: Payer: Self-pay | Admitting: Family Medicine

## 2017-12-01 ENCOUNTER — Ambulatory Visit (INDEPENDENT_AMBULATORY_CARE_PROVIDER_SITE_OTHER): Payer: Medicaid Other | Admitting: Family Medicine

## 2017-12-01 VITALS — BP 144/79 | HR 91 | Temp 98.6°F | Wt 207.7 lb

## 2017-12-01 DIAGNOSIS — M25562 Pain in left knee: Secondary | ICD-10-CM | POA: Diagnosis not present

## 2017-12-01 DIAGNOSIS — G8929 Other chronic pain: Secondary | ICD-10-CM

## 2017-12-01 DIAGNOSIS — M5126 Other intervertebral disc displacement, lumbar region: Secondary | ICD-10-CM

## 2017-12-01 DIAGNOSIS — M47816 Spondylosis without myelopathy or radiculopathy, lumbar region: Secondary | ICD-10-CM

## 2017-12-01 DIAGNOSIS — M25561 Pain in right knee: Secondary | ICD-10-CM

## 2017-12-01 DIAGNOSIS — F411 Generalized anxiety disorder: Secondary | ICD-10-CM

## 2017-12-01 DIAGNOSIS — G4700 Insomnia, unspecified: Secondary | ICD-10-CM

## 2017-12-01 DIAGNOSIS — M5136 Other intervertebral disc degeneration, lumbar region: Secondary | ICD-10-CM

## 2017-12-01 MED ORDER — ZOLPIDEM TARTRATE 10 MG PO TABS: 10.0000 mg | ORAL_TABLET | Freq: Every evening | ORAL | 0 refills | Status: DC | PRN

## 2017-12-01 MED ORDER — HYDROCODONE-ACETAMINOPHEN 5-325 MG PO TABS: 1.0000 | ORAL_TABLET | Freq: Three times a day (TID) | ORAL | 0 refills | Status: DC | PRN

## 2017-12-01 NOTE — Progress Notes (Signed)
BP (!) 144/79 (BP Location: Right Arm, Patient Position: Sitting, Cuff Size: Normal)   Pulse 91   Temp 98.6 F (37 C) (Oral)   Wt 207 lb 11.2 oz (94.2 kg)   SpO2 99%   BMI 39.24 kg/m    Subjective:    Patient ID: Caitlin Reid, female    DOB: 01/23/1981, 37 y.o.   MRN: [id number]  HPI: Caitlin Reid is a 37 y.o. female  Chief Complaint  Patient presents with  . Knee Pain    Patient states pain is severe. Crawling up steps. Constant. Has progressed since last visit. Hasn't started Lasix yet but has got OTC dieuretic. Patient stated she has not been able to get to pharmacy because of work.   . Hand Pain  . Leg Pain   Pt here today for b/l hand and knee pain and swelling f/u. Basic autoimmune/inflammatory labs done at previous visit which were negative. Pt has not picked up lasix yet and still having a fair amount of b/l LE edema. Taking 800 mg ibuprofen and lots of tylenol with minimal relief. States when she's up and moving her pain is under fairly good control but any time she stops for a bit and has to get back up she is in excruciating pain. This has all started within the last few months for her other than the swelling, which has been ongoing for several years. Denies fevers, rashes, unexpected fatigue.   Struggling with her chronic back issues as well. Was previously followed by pain management for bulging discs and lumbar arthritis but had insurance changes and stopped about 1-2 years ago. No relief with cymbalta, gabapentin, amitriptyline, lyrica. Has severe side effects with prednisone. Wanting to get back in with pain management.   Having severe insomnia as well, not relieved by any amount of OTC sleep aids or her regular xanax rx. Going through a significantly stressful time currently and feels like that's contributing, but this has been a long time problem for her. Previously has done very well on prn ambien.   Still having recurrent anxiety and panic episodes,  states this has been ongoing since age 60 but much worse right now with the pain she's in and her social situation. Taking xanax TID with mild relief of sxs.   GAD 7 : Generalized Anxiety Score 11/21/2017 08/19/2017 04/11/2017 03/12/2017  Nervous, Anxious, on Edge 0 3 0 3  Control/stop worrying 0 3 0 3  Worry too much - different things 1 3 0 3  Trouble relaxing 0 '3 1 3  '$ Restless 0 3 0 1  Easily annoyed or irritable 0 3 0 3  Afraid - awful might happen 0 3 0 2  Total GAD 7 Score '1 21 1 18  '$ Anxiety Difficulty Not difficult at all - Not difficult at all Extremely difficult    Past Medical History:  Diagnosis Date  . ADHD (attention deficit hyperactivity disorder)   . Allergy   . Anxiety   . Foot pain, left   . Fracture of coccyx ([place])   . Heel spur left  . Insomnia   . Kidney stones   . PID (acute pelvic inflammatory disease)   . Pleurisy    Patient reports that she has had this twice  . PTSD (post-traumatic stress disorder)    Social History   Socioeconomic History  . Marital status: Single    Spouse name: Not on file  . Number of children: Not on file  . Years  of education: Not on file  . Highest education level: Not on file  Social Needs  . Financial resource strain: Not on file  . Food insecurity - worry: Not on file  . Food insecurity - inability: Not on file  . Transportation needs - medical: Not on file  . Transportation needs - non-medical: Not on file  Occupational History  . Not on file  Tobacco Use  . Smoking status: Current Every Day Smoker    Packs/day: 0.25    Types: Cigarettes  . Smokeless tobacco: Never Used  Substance and Sexual Activity  . Alcohol use: No  . Drug use: No  . Sexual activity: Not on file  Other Topics Concern  . Not on file  Social History Narrative  . Not on file   Relevant past medical, surgical, family and social history reviewed and updated as indicated. Interim medical history since our last visit reviewed. Allergies and  medications reviewed and updated.  Review of Systems  Constitutional: Negative.   HENT: Negative.   Respiratory: Negative.   Cardiovascular: Positive for leg swelling.  Gastrointestinal: Negative.   Musculoskeletal: Positive for arthralgias, back pain, gait problem and joint swelling.  Skin: Positive for color change (mild erythema of hands and knees).  Psychiatric/Behavioral: Positive for sleep disturbance. The patient is nervous/anxious.     Per HPI unless specifically indicated above     Objective:    BP (!) 144/79 (BP Location: Right Arm, Patient Position: Sitting, Cuff Size: Normal)   Pulse 91   Temp 98.6 F (37 C) (Oral)   Wt 207 lb 11.2 oz (94.2 kg)   SpO2 99%   BMI 39.24 kg/m   Wt Readings from Last 3 Encounters:  12/01/17 207 lb 11.2 oz (94.2 kg)  11/21/17 201 lb 6.4 oz (91.4 kg)  08/19/17 220 lb (99.8 kg)    Physical Exam  Constitutional: She is oriented to person, place, and time. She appears well-developed and well-nourished. No distress.  HENT:  Head: Atraumatic.  Eyes: Conjunctivae are normal. Pupils are equal, round, and reactive to light.  Neck: Normal range of motion. Neck supple.  Cardiovascular: Normal rate and normal heart sounds.  Pulmonary/Chest: Effort normal and breath sounds normal.  Musculoskeletal:  Antalgic gait. Slight decrease in strength b/l UE/LE ROM exam of knees limited by pt discomfort TTP along anterior patella b/l  Neurological: She is alert and oriented to person, place, and time.  Skin: Skin is warm and dry. There is erythema (mild erythema b/l hands and knees).  Psychiatric: Judgment and thought content normal.  tearful  Nursing note and vitals reviewed.   Results for orders placed or performed in visit on 11/21/17  Rheumatoid Factor  Result Value Ref Range   Rhuematoid fact SerPl-aCnc <10.0 0.0 - 13.9 IU/mL  ANA w/Reflex  Result Value Ref Range   Anit Nuclear Antibody(ANA) Negative Negative  Sed Rate (ESR)  Result  Value Ref Range   Sed Rate 3 0 - 32 mm/hr      Assessment & Plan:   Problem List Items Addressed This Visit      Musculoskeletal and Integument   Lumbar spondylosis (L5-S1 DDD) - Primary (Chronic)    Will get pain management back on board, referral generated. Long discussion with pt about concurrent xanax use with pain medication, pt chooses to treat her pain right now. Will give hydrocodone to bridge her until she can see pain management, pt aware supply needs to last her until then. Strict precautions reviewed with  her, she is agreeable.       Relevant Medications   HYDROcodone-acetaminophen (NORCO/VICODIN) 5-325 MG tablet   Other Relevant Orders   Ambulatory referral to Pain Clinic   L5-S1 lumbar bulging disc (Right) (Chronic)     Other   Generalized anxiety disorder    D/c xanax for now, referral to Psychiatry placed to manage her severe anxiety as she's now being treated with opioid pain medications for her significant back issues and other joint pains.       Relevant Orders   Ambulatory referral to Psychiatry   Insomnia    Will give very small supply of ambien until she can get in with Psychiatry for management of her conditions. Extreme caution discussed and pt aware to space out her prn pain medications with the ambien d/t sedation risks.        Other Visit Diagnoses    Chronic pain of both knees       Referral to Rheum placed given joint stiffness, pain, swelling, and erythema despite normal screening labs. Would like to r/o autoimmune arthritis causes   Relevant Medications   HYDROcodone-acetaminophen (NORCO/VICODIN) 5-325 MG tablet   Other Relevant Orders   Ambulatory referral to Rheumatology       Follow up plan: Return in about 4 weeks (around 12/29/2017) for Insomnia, pain f/u if not in with specialists.

## 2017-12-04 NOTE — Assessment & Plan Note (Signed)
D/c xanax for now, referral to Psychiatry placed to manage her severe anxiety as she's now being treated with opioid pain medications for her significant back issues and other joint pains.

## 2017-12-04 NOTE — Assessment & Plan Note (Signed)
Will get pain management back on board, referral generated. Long discussion with pt about concurrent xanax use with pain medication, pt chooses to treat her pain right now. Will give hydrocodone to bridge her until she can see pain management, pt aware supply needs to last her until then. Strict precautions reviewed with her, she is agreeable.

## 2017-12-04 NOTE — Patient Instructions (Signed)
Follow up in 1 month   

## 2017-12-04 NOTE — Assessment & Plan Note (Signed)
Will give very small supply of ambien until she can get in with Psychiatry for management of her conditions. Extreme caution discussed and pt aware to space out her prn pain medications with the ambien d/t sedation risks.

## 2017-12-17 ENCOUNTER — Telehealth: Payer: Self-pay

## 2017-12-17 DIAGNOSIS — M25562 Pain in left knee: Secondary | ICD-10-CM

## 2017-12-17 DIAGNOSIS — M25561 Pain in right knee: Secondary | ICD-10-CM

## 2017-12-17 DIAGNOSIS — G8929 Other chronic pain: Secondary | ICD-10-CM

## 2017-12-17 DIAGNOSIS — M47816 Spondylosis without myelopathy or radiculopathy, lumbar region: Secondary | ICD-10-CM

## 2017-12-17 NOTE — Telephone Encounter (Signed)
Copied from CRM [id number]. Topic: Referral - Request >> Dec 17, 2017  2:07 PM Crist Infante wrote: Reason for CRM: pt calling to ask if [facility] can order an MRI for back and knees. L1-L5 lower lumbar and both knees Pt went to Dr Metta Clines today, a specialist referred by [facility], and he advised pt this is something she needs to have done.  Pt states the only way her insurance will pay is if the order comes from her pcp. Advised pt she should have Dr Metta Clines office send over the info as well

## 2017-12-17 NOTE — Telephone Encounter (Signed)
Routing to provider to advise. Patient had an MRI in 2016.

## 2017-12-18 NOTE — Telephone Encounter (Signed)
MRI submitted to [facility] and pending review.

## 2017-12-18 NOTE — Telephone Encounter (Signed)
Placed orders for all three per request.

## 2017-12-19 NOTE — Telephone Encounter (Signed)
Attempted to contact pt regarding MRI; see telephone encounters from 12/19/17 and CRM [phone]; left message on voice mail of [phone].

## 2017-12-19 NOTE — Telephone Encounter (Signed)
Tried calling patient, a man answered and stated that patient was about to go on her break and he'd tell her to call us back. CRM created for patient to call back.

## 2017-12-19 NOTE — Telephone Encounter (Signed)
Please let the patient and/or Dr. Metta Clines know.

## 2017-12-19 NOTE — Telephone Encounter (Signed)
MRI's were denied by medicaid. Leaving reason for you to review.

## 2017-12-19 NOTE — Telephone Encounter (Signed)
Patient notified

## 2017-12-22 ENCOUNTER — Encounter: Payer: Self-pay | Admitting: Family Medicine

## 2017-12-22 ENCOUNTER — Ambulatory Visit (INDEPENDENT_AMBULATORY_CARE_PROVIDER_SITE_OTHER): Payer: Medicaid Other | Admitting: Family Medicine

## 2017-12-22 VITALS — BP 119/82 | HR 82 | Temp 98.1°F | Wt 199.6 lb

## 2017-12-22 DIAGNOSIS — G8929 Other chronic pain: Secondary | ICD-10-CM

## 2017-12-22 DIAGNOSIS — F411 Generalized anxiety disorder: Secondary | ICD-10-CM

## 2017-12-22 DIAGNOSIS — G4700 Insomnia, unspecified: Secondary | ICD-10-CM | POA: Diagnosis not present

## 2017-12-22 MED ORDER — ALPRAZOLAM 1 MG PO TABS: 1.0000 mg | ORAL_TABLET | Freq: Three times a day (TID) | ORAL | 0 refills | Status: DC | PRN

## 2017-12-22 MED ORDER — QUETIAPINE FUMARATE 25 MG PO TABS: 25.0000 mg | ORAL_TABLET | Freq: Every day | ORAL | 0 refills | Status: DC

## 2017-12-22 NOTE — Assessment & Plan Note (Signed)
Did not tolerate hydrocodone, d/c'd medication. Will work with Pain Management on alternative strategies for pain relief. Defer to their management at this point

## 2017-12-22 NOTE — Assessment & Plan Note (Signed)
Will restart xanax since she won't be taking regular opioid medications for her pain management program. Pt knows she will have to establish with Psychiatry to manage her anxiety should she decide to go back on pain medications.

## 2017-12-22 NOTE — Assessment & Plan Note (Signed)
Ambien not affordable for entire month of pills. Will try seroquel and monitor closely for benefit. Start at 25 mg, can increase to 50 mg if tolerated. F/u in 6 weeks for recheck

## 2017-12-22 NOTE — Patient Instructions (Addendum)
[facility] - [phone]

## 2017-12-22 NOTE — Progress Notes (Signed)
BP 119/82   Pulse 82   Temp 98.1 F (36.7 C) (Oral)   Wt 199 lb 9.6 oz (90.5 kg)   SpO2 100%   BMI 37.71 kg/m    Subjective:    Patient ID: Caitlin Reid, female    DOB: [birth year], 37 y.o.   MRN: [id number]  HPI: Caitlin Reid is a 37 y.o. female  Chief Complaint  Patient presents with  . Anxiety    pt states she would like to go back on alprazolam. States she is no longer taking hydrocodone.   Pt here today to discuss her poorly controlled anxiety since coming off xanax and starting pain medication to bridge her until she can start working with pain management. Pt aware that she cannot be on both xanax and a pain medicine at the same time. States she vomited so much on the hydrocodone that she was sent home from work several times because of it. Stopped it about a week ago and does not want to start any other pain medicines. Since being off xanax, panic episodes have become significantly worse. Waking her up in sweats in the night, happening all throughout the day.   Established with pain clinic, discussing getting nerves burnt for long-term relief currently. Has f/u with them at the end of the month.   Can't get enough ambien to last her the full month, wanting something else for her severe insomnia.   Past Medical History:  Diagnosis Date  . ADHD (attention deficit hyperactivity disorder)   . Allergy   . Anxiety   . Foot pain, left   . Fracture of coccyx ([place])   . Heel spur left  . Insomnia   . Kidney stones   . PID (acute pelvic inflammatory disease)   . Pleurisy    Patient reports that she has had this twice  . PTSD (post-traumatic stress disorder)    Social History   Socioeconomic History  . Marital status: Single    Spouse name: Not on file  . Number of children: Not on file  . Years of education: Not on file  . Highest education level: Not on file  Social Needs  . Financial resource strain: Not on file  . Food insecurity - worry: Not on file    . Food insecurity - inability: Not on file  . Transportation needs - medical: Not on file  . Transportation needs - non-medical: Not on file  Occupational History  . Not on file  Tobacco Use  . Smoking status: Current Every Day Smoker    Packs/day: 0.25    Types: Cigarettes  . Smokeless tobacco: Never Used  Substance and Sexual Activity  . Alcohol use: No  . Drug use: No  . Sexual activity: Not on file  Other Topics Concern  . Not on file  Social History Narrative  . Not on file   Relevant past medical, surgical, family and social history reviewed and updated as indicated. Interim medical history since our last visit reviewed. Allergies and medications reviewed and updated.  Review of Systems  Constitutional: Negative.   HENT: Negative.   Eyes: Negative.   Respiratory: Negative.   Cardiovascular: Negative.   Gastrointestinal: Negative.   Genitourinary: Negative.   Musculoskeletal: Positive for arthralgias and back pain.  Skin: Negative.   Neurological: Negative.   Psychiatric/Behavioral: Positive for sleep disturbance. The patient is nervous/anxious.    Per HPI unless specifically indicated above     Objective:    BP  119/82   Pulse 82   Temp 98.1 F (36.7 C) (Oral)   Wt 199 lb 9.6 oz (90.5 kg)   SpO2 100%   BMI 37.71 kg/m   Wt Readings from Last 3 Encounters:  12/22/17 199 lb 9.6 oz (90.5 kg)  12/01/17 207 lb 11.2 oz (94.2 kg)  11/21/17 201 lb 6.4 oz (91.4 kg)    Physical Exam  Constitutional: She is oriented to person, place, and time. She appears well-developed and well-nourished. No distress.  HENT:  Head: Atraumatic.  Eyes: Conjunctivae are normal. Pupils are equal, round, and reactive to light.  Neck: Normal range of motion. Neck supple.  Cardiovascular: Normal rate and normal heart sounds.  Pulmonary/Chest: Effort normal and breath sounds normal. No respiratory distress.  Musculoskeletal: Normal range of motion.  Neurological: She is alert and  oriented to person, place, and time.  Skin: Skin is warm and dry.  Psychiatric: She has a normal mood and affect. Her behavior is normal.  Nursing note and vitals reviewed.  Results for orders placed or performed in visit on 11/21/17  Rheumatoid Factor  Result Value Ref Range   Rhuematoid fact SerPl-aCnc <10.0 0.0 - 13.9 IU/mL  ANA w/Reflex  Result Value Ref Range   Anit Nuclear Antibody(ANA) Negative Negative  Sed Rate (ESR)  Result Value Ref Range   Sed Rate 3 0 - 32 mm/hr      Assessment & Plan:   Problem List Items Addressed This Visit      Other   Chronic pain - Primary (Chronic)    Did not tolerate hydrocodone, d/c'd medication. Will work with Pain Management on alternative strategies for pain relief. Defer to their management at this point      Generalized anxiety disorder    Will restart xanax since she won't be taking regular opioid medications for her pain management program. Pt knows she will have to establish with Psychiatry to manage her anxiety should she decide to go back on pain medications.       Relevant Medications   ALPRAZolam (XANAX) 1 MG tablet   Insomnia    Ambien not affordable for entire month of pills. Will try seroquel and monitor closely for benefit. Start at 25 mg, can increase to 50 mg if tolerated. F/u in 6 weeks for recheck          Follow up plan: Return in about 6 weeks (around 02/02/2018) for Sleep.

## 2018-01-16 ENCOUNTER — Telehealth: Payer: Self-pay | Admitting: Family Medicine

## 2018-01-16 ENCOUNTER — Other Ambulatory Visit: Payer: Self-pay | Admitting: Family Medicine

## 2018-01-16 MED ORDER — ALPRAZOLAM 1 MG PO TABS: 1.0000 mg | ORAL_TABLET | Freq: Three times a day (TID) | ORAL | 0 refills | Status: DC | PRN

## 2018-01-16 NOTE — Telephone Encounter (Signed)
Patient feels she may need to increase her dose of Seroquel since she is still unable to sleep through the night.  She is taking up to 3 in order to sleep through the night.   [facility]  Thanks

## 2018-01-16 NOTE — Telephone Encounter (Signed)
Copied from CRM [id number]. Topic: Quick Communication - Rx Refill/Question >> Jan 16, 2018  9:17 AM Lelon Frohlich, RMA wrote: Medication: xanax 1 mg   Has the patient contacted their pharmacy? yes   (Agent: If no, request that the patient contact the pharmacy for the refill.)   Preferred Pharmacy (with phone number or street name): [facility]   Agent: Please be advised that RX refills may take up to 3 business days. We ask that you follow-up with your pharmacy.

## 2018-01-16 NOTE — Telephone Encounter (Signed)
Copied from CRM #[id number]. Topic: Quick Communication - Rx Refill/Question >> Jan 16, 2018  9:17 AM Golden, Tashia, RMA wrote: Medication: xanax 1 mg   Has the patient contacted their pharmacy? yes   (Agent: If no, request that the patient contact the pharmacy for the refill.)   Preferred Pharmacy (with phone number or street name): [facility]   Agent: Please be advised that RX refills may take up to 3 business days. We ask that you follow-up with your pharmacy. 

## 2018-01-16 NOTE — Telephone Encounter (Signed)
Xanax LOV: 12/22/17 PCP: [facility] Pharmacy: [facility] [place], [place]

## 2018-01-16 NOTE — Telephone Encounter (Signed)
Rx printed and faxed to [facility]

## 2018-01-19 MED ORDER — QUETIAPINE FUMARATE 100 MG PO TABS: 100.0000 mg | ORAL_TABLET | Freq: Every day | ORAL | 0 refills | Status: DC

## 2018-01-19 NOTE — Telephone Encounter (Signed)
Already refilled last week

## 2018-01-19 NOTE — Telephone Encounter (Signed)
Husband notified  

## 2018-01-19 NOTE — Telephone Encounter (Signed)
Sent in 100 mg tabs of the seroquel, we can discuss how that's going at her upcoming visit. Please let her know if that's too much she can cut them in half

## 2018-01-19 NOTE — Telephone Encounter (Signed)
Rerouted to [facility]

## 2018-01-19 NOTE — Telephone Encounter (Signed)
Alprazolam refill request   LOV:12/22/17  PCP Roosvelt Maser PA  Pharmacy: [facility]

## 2018-01-19 NOTE — Telephone Encounter (Signed)
Not our pt

## 2018-02-02 ENCOUNTER — Ambulatory Visit: Payer: Medicaid Other | Admitting: Family Medicine

## 2018-02-03 ENCOUNTER — Telehealth: Payer: Self-pay

## 2018-02-12 ENCOUNTER — Telehealth: Payer: Self-pay | Admitting: Family Medicine

## 2018-02-12 ENCOUNTER — Other Ambulatory Visit: Payer: Self-pay | Admitting: Family Medicine

## 2018-02-12 MED ORDER — FUROSEMIDE 20 MG PO TABS: 20.0000 mg | ORAL_TABLET | Freq: Every day | ORAL | 3 refills | Status: AC

## 2018-02-12 MED ORDER — ALPRAZOLAM 1 MG PO TABS: 1.0000 mg | ORAL_TABLET | Freq: Three times a day (TID) | ORAL | 0 refills | Status: DC | PRN

## 2018-02-12 NOTE — Telephone Encounter (Signed)
Patient needs a refill on Lasix sent to [facility]   Thanks  Kayley [phone]

## 2018-02-12 NOTE — Telephone Encounter (Signed)
Refill sent.

## 2018-03-13 ENCOUNTER — Other Ambulatory Visit: Payer: Self-pay | Admitting: Family Medicine

## 2018-03-13 ENCOUNTER — Telehealth: Payer: Self-pay | Admitting: Family Medicine

## 2018-03-13 DIAGNOSIS — Z9114 Patient's other noncompliance with medication regimen: Secondary | ICD-10-CM

## 2018-03-13 MED ORDER — ALPRAZOLAM 1 MG PO TABS: 1.0000 mg | ORAL_TABLET | Freq: Three times a day (TID) | ORAL | 0 refills | Status: AC | PRN

## 2018-03-13 NOTE — Telephone Encounter (Signed)
Routing to provider  

## 2018-03-13 NOTE — Telephone Encounter (Signed)
Copied from CRM [id number]. Topic: Quick Communication - See Telephone Encounter >> Mar 13, 2018  3:18 PM Lorrine Kin, NT wrote: CRM for notification. See Telephone encounter for: 03/13/18. Marchelle Folks calling and states they just received an order for ALPRAZolam Prudy Feeler) 1 MG tablet. States she was checking PNP and it shows that the patient has also received the 1mg  tablet on 02/13/18 with [facility], but that she last received 0.5mg  tablet from a Dr 02/15/18 at [facility]. States that the patient is always giving reasons to get the medication early. [place] would like a call back to discuss this and whether she needs to fill this rx by/on 03/15/18. Please advise. CB#: [phone]

## 2018-03-16 NOTE — Telephone Encounter (Signed)
Do you want me to call the patient with this information?

## 2018-03-16 NOTE — Telephone Encounter (Signed)
[facility] was called back 4/26 and asked to destroy my script that was faxed over. Controlled substance agreement violated, will document that and look into the controlled substance database for further discrepancies

## 2018-03-16 NOTE — Assessment & Plan Note (Signed)
Pt concurrently saw another provider (Dr. Virgel Manifold at [facility]) and had xanax and ambien filled in the last 2 weeks while still receiving monthly xanax from me. Pt will no longer be able to receive controlled substances from this office.

## 2018-03-16 NOTE — Telephone Encounter (Signed)
Can we call [facility] and inquire about the 09/01/2017 script for [place]? I did not write this script for her so I would like to know how it came in.

## 2018-03-17 NOTE — Telephone Encounter (Signed)
Fax received from [facility] for rx that was in our system from 08/19/17 but the fill I was inquiring about was the one on PMP site from 09/01/17 that I do not have record or recollection of. I have never handwritten this for her so it should be in the system if I gave it

## 2018-03-17 NOTE — Telephone Encounter (Signed)
Spoke with another pharmacist for clarification.  What happened was is medicaid would only pay for #15 of Ambien per month, not #30. On the 08/19/2017 patient picked up #15 with medicaid covering that medication. On 09/01/2018 she picked up the other #15 with self pay off the same prescription from 08/19/17 that was written for #30.

## 2018-03-17 NOTE — Telephone Encounter (Signed)
Spoke with pharmacy.  It was a printed Rx with your signature, he's faxing [place] the copy.

## 2018-03-17 NOTE — Telephone Encounter (Signed)
LVM for patient to return phone call.  

## 2018-03-17 NOTE — Telephone Encounter (Signed)
Ok thank you. Let's call her and let her know that her controlled substance contract has been violated as she has been getting her xanax and ambien from another provider so we will no longer be able to provide her with any controlled substances from this office.

## 2018-03-18 NOTE — Telephone Encounter (Signed)
Left message on machine for pt to return call to the office.  

## 2018-03-19 NOTE — Telephone Encounter (Signed)
Left message on machine for pt to return call to the office. Will close encounter until patient calls back.

## 2018-03-23 ENCOUNTER — Ambulatory Visit: Payer: Medicaid Other | Admitting: Family Medicine

## 2018-03-27 ENCOUNTER — Encounter: Payer: Self-pay | Admitting: Family Medicine

## 2018-06-10 ENCOUNTER — Other Ambulatory Visit: Payer: Self-pay

## 2018-06-10 ENCOUNTER — Ambulatory Visit (INDEPENDENT_AMBULATORY_CARE_PROVIDER_SITE_OTHER): Payer: Medicaid Other | Admitting: Family Medicine

## 2018-06-10 ENCOUNTER — Encounter: Payer: Self-pay | Admitting: Family Medicine

## 2018-06-10 VITALS — BP 135/89 | HR 90 | Temp 98.4°F | Ht 62.0 in | Wt 183.0 lb

## 2018-06-10 DIAGNOSIS — F909 Attention-deficit hyperactivity disorder, unspecified type: Secondary | ICD-10-CM | POA: Diagnosis not present

## 2018-06-10 DIAGNOSIS — Z9114 Patient's other noncompliance with medication regimen: Secondary | ICD-10-CM

## 2018-06-10 DIAGNOSIS — F411 Generalized anxiety disorder: Secondary | ICD-10-CM

## 2018-06-10 NOTE — Patient Instructions (Addendum)
[facility] - [address], [place], [place] [address] [phone]    

## 2018-06-10 NOTE — Progress Notes (Signed)
BP 135/89   Pulse 90   Temp 98.4 F (36.9 C) (Oral)   Ht _0  (1.575 m)   Wt 183 lb (83 kg)   SpO2 99%   BMI 33.47 kg/m    Subjective:    Patient ID: Caitlin Reid, female    DOB: [birth year], 37 y.o.   MRN: [id number]  HPI: Caitlin Reid is a 37 y.o. female  Chief Complaint  Patient presents with  . Anxiety  . Insomnia   Pt here today following up on her anxiety and insomnia. Previously under controlled substance contract with me for her xanax but broke contract when she returned to her previous provider several hours away and began filling her xanax through both providers. Her remaining scripts through this office were then terminated. Last fill from other provider was last week (7/17 for xanax and 7/19 for adderall). States she lost her xanax a week ago when she moved back to town and is having unbearable anxiety since being off of it. Lots going on in her life with her significant other in prison and currently living with a friend temporarily looking for work. Can't sleep, frequent crying spells, agitation, panic episodes frequently throughout the day. States she stopped her [place] several months ago and does not want to go back on. Not sure she wants to restart seroquel either. Denies SI/HI.    Relevant past medical, surgical, family and social history reviewed and updated as indicated. Interim medical history since our last visit reviewed. Allergies and medications reviewed and updated.  Review of Systems  Per HPI unless specifically indicated above     Objective:    BP 135/89   Pulse 90   Temp 98.4 F (36.9 C) (Oral)   Ht _1  (1.575 m)   Wt 183 lb (83 kg)   SpO2 99%   BMI 33.47 kg/m   Wt Readings from Last 3 Encounters:  06/10/18 183 lb (83 kg)  12/22/17 199 lb 9.6 oz (90.5 kg)  12/01/17 207 lb 11.2 oz (94.2 kg)    Physical Exam  Constitutional: She is oriented to person, place, and time. She appears well-developed and well-nourished. No  distress.  HENT:  Head: Atraumatic.  Eyes: Conjunctivae are normal.  Neck: Normal range of motion. Neck supple.  Cardiovascular: Normal rate, regular rhythm and normal heart sounds.  Pulmonary/Chest: Effort normal.  Musculoskeletal: Normal range of motion.  Neurological: She is alert and oriented to person, place, and time.  Skin: Skin is warm and dry.  Psychiatric:  Tearful, jittery   Nursing note and vitals reviewed.   Results for orders placed or performed in visit on 11/21/17  Rheumatoid Factor  Result Value Ref Range   Rhuematoid fact SerPl-aCnc <10.0 0.0 - 13.9 IU/mL  ANA w/Reflex  Result Value Ref Range   Anti Nuclear Antibody(ANA) Negative Negative  Sed Rate (ESR)  Result Value Ref Range   Sed Rate 3 0 - 32 mm/hr      Assessment & Plan:   Problem List Items Addressed This Visit      Other   Violation of controlled substance agreement - Primary    Tried calling patient many times since we were alerted of her filling xanax from two providers simultaneously to alert her about her contract being terminated and that she would not be able to have controlled substances through [place] any longer. Certified letter was sent in May as well. When informed this today, she denies knowing about any contract at  first but then later states she didn't realize there were rules and thought that since she had left this practice to go back to her old practice that she was under no obligation. Furthermore, she failed to disclose today that she also has two strengths of adderall from this other provider that I was able to see on the controlled substance database. Was firm with patient on our policy that she would no longer receive controlled medications from our office.       Generalized anxiety disorder    No xanax from this office d/t violation of contract in addition to last fill from other provider being a week ago. Referred her to [facility] since she wants to stay within [place]. Recommended  concurrent counseling, pt not sure at this time. She will go see them ASAP and discuss from there what to do. Does not want to start non-controlled psychiatric medications today.       ADHD (attention deficit hyperactivity disorder)    Diagnosed and treated by her previous provider who is currently providing her controlled substances. Pt does not feel the adderall is helping and unsure if this is actually what's wrong with her. D/c adderall as it's likely worsening her anxiousness and sleep issues.           Follow up plan: Return if symptoms worsen or fail to improve.

## 2018-06-12 DIAGNOSIS — F909 Attention-deficit hyperactivity disorder, unspecified type: Secondary | ICD-10-CM | POA: Insufficient documentation

## 2018-06-12 NOTE — Assessment & Plan Note (Signed)
Tried calling patient many times since we were alerted of her filling xanax from two providers simultaneously to alert her about her contract being terminated and that she would not be able to have controlled substances through [place] any longer. Certified letter was sent in May as well. When informed this today, she denies knowing about any contract at first but then later states she didn't realize there were rules and thought that since she had left this practice to go back to her old practice that she was under no obligation. Furthermore, she failed to disclose today that she also has two strengths of adderall from this other provider that I was able to see on the controlled substance database. Was firm with patient on our policy that she would no longer receive controlled medications from our office.

## 2018-06-12 NOTE — Assessment & Plan Note (Signed)
No xanax from this office d/t violation of contract in addition to last fill from other provider being a week ago. Referred her to [facility] since she wants to stay within [place]. Recommended concurrent counseling, pt not sure at this time. She will go see them ASAP and discuss from there what to do. Does not want to start non-controlled psychiatric medications today.

## 2018-06-12 NOTE — Assessment & Plan Note (Signed)
Diagnosed and treated by her previous provider who is currently providing her controlled substances. Pt does not feel the adderall is helping and unsure if this is actually what's wrong with her. D/c adderall as it's likely worsening her anxiousness and sleep issues.

## 2018-07-29 NOTE — Telephone Encounter (Signed)
Opened in error

## 2019-07-28 ENCOUNTER — Emergency Department: Payer: Medicaid Other

## 2019-07-28 ENCOUNTER — Other Ambulatory Visit: Payer: Self-pay

## 2019-07-28 ENCOUNTER — Ambulatory Visit: Payer: Self-pay | Admitting: *Deleted

## 2019-07-28 ENCOUNTER — Emergency Department
Admission: EM | Admit: 2019-07-28 | Discharge: 2019-07-28 | Disposition: A | Discharge status: Home / Self Care | Payer: Medicaid Other | Attending: Student | Admitting: Student

## 2019-07-28 DIAGNOSIS — F17210 Nicotine dependence, cigarettes, uncomplicated: Secondary | ICD-10-CM | POA: Insufficient documentation

## 2019-07-28 DIAGNOSIS — G8929 Other chronic pain: Secondary | ICD-10-CM | POA: Insufficient documentation

## 2019-07-28 DIAGNOSIS — R51 Headache: Secondary | ICD-10-CM | POA: Diagnosis not present

## 2019-07-28 DIAGNOSIS — R509 Fever, unspecified: Secondary | ICD-10-CM | POA: Diagnosis not present

## 2019-07-28 DIAGNOSIS — M545 Low back pain: Secondary | ICD-10-CM | POA: Diagnosis present

## 2019-07-28 DIAGNOSIS — Z79899 Other long term (current) drug therapy: Secondary | ICD-10-CM | POA: Diagnosis not present

## 2019-07-28 DIAGNOSIS — R350 Frequency of micturition: Secondary | ICD-10-CM | POA: Diagnosis not present

## 2019-07-28 DIAGNOSIS — N12 Tubulo-interstitial nephritis, not specified as acute or chronic: Secondary | ICD-10-CM | POA: Insufficient documentation

## 2019-07-28 LAB — COMPREHENSIVE METABOLIC PANEL
ALT: 42 U/L (ref 0–44)
AST: 56 U/L — ABNORMAL HIGH (ref 15–41)
Albumin: 2.8 g/dL — ABNORMAL LOW (ref 3.5–5.0)
Alkaline Phosphatase: 45 U/L (ref 38–126)
Anion gap: 11 (ref 5–15)
BUN: 8 mg/dL (ref 6–20)
CO2: 28 mmol/L (ref 22–32)
Calcium: 7.8 mg/dL — ABNORMAL LOW (ref 8.9–10.3)
Chloride: 96 mmol/L — ABNORMAL LOW (ref 98–111)
Creatinine, Ser: 1.01 mg/dL — ABNORMAL HIGH (ref 0.44–1.00)
GFR calc Af Amer: >60 mL/min (ref >60)
GFR calc non Af Amer: >60 mL/min (ref >60)
Glucose, Bld: 248 mg/dL — ABNORMAL HIGH (ref 70–99)
Potassium: 3.5 mmol/L (ref 3.5–5.1)
Sodium: 135 mmol/L (ref 135–145)
Total Bilirubin: 0.7 mg/dL (ref 0.3–1.2)
Total Protein: 6.1 g/dL — ABNORMAL LOW (ref 6.5–8.1)

## 2019-07-28 LAB — CBC WITH DIFFERENTIAL/PLATELET
Abs Immature Granulocytes: 0.04 10*3/uL (ref 0.00–0.07)
Basophils Absolute: 0 10*3/uL (ref 0.0–0.1)
Basophils Relative: 0 %
Eosinophils Absolute: 0.1 10*3/uL (ref 0.0–0.5)
Eosinophils Relative: 1 %
HCT: 35.1 % — ABNORMAL LOW (ref 36.0–46.0)
Hemoglobin: 11.2 g/dL — ABNORMAL LOW (ref 12.0–15.0)
Immature Granulocytes: 1 %
Lymphocytes Relative: 10 %
Lymphs Abs: 0.8 10*3/uL (ref 0.7–4.0)
MCH: 29 pg (ref 26.0–34.0)
MCHC: 31.9 g/dL (ref 30.0–36.0)
MCV: 90.9 fL (ref 80.0–100.0)
Monocytes Absolute: 0.9 10*3/uL (ref 0.1–1.0)
Monocytes Relative: 11 %
Neutro Abs: 6.4 10*3/uL (ref 1.7–7.7)
Neutrophils Relative %: 77 %
Platelets: 188 10*3/uL (ref 150–400)
RBC: 3.86 MIL/uL — ABNORMAL LOW (ref 3.87–5.11)
RDW: 12.7 % (ref 11.5–15.5)
WBC: 8.3 10*3/uL (ref 4.0–10.5)
nRBC: 0 % (ref 0.0–0.2)

## 2019-07-28 LAB — URINALYSIS, COMPLETE (UACMP) WITH MICROSCOPIC
Bilirubin Urine: NEGATIVE
Glucose, UA: NEGATIVE mg/dL
Ketones, ur: NEGATIVE mg/dL
Nitrite: NEGATIVE
Protein, ur: 100 mg/dL — AB
Specific Gravity, Urine: 1.018 (ref 1.005–1.030)
WBC, UA: >50 WBC/hpf — ABNORMAL HIGH (ref 0–5)
pH: 6 (ref 5.0–8.0)

## 2019-07-28 LAB — PREGNANCY, URINE: Preg Test, Ur: NEGATIVE

## 2019-07-28 MED ORDER — KETOROLAC TROMETHAMINE 30 MG/ML IJ SOLN
15.0000 mg | Freq: Once | INTRAMUSCULAR | Status: AC
  Administered 2019-07-28: 15 mg via INTRAVENOUS

## 2019-07-28 MED ORDER — SODIUM CHLORIDE 0.9 % IV BOLUS
1000.0000 mL | Freq: Once | INTRAVENOUS | Status: AC
  Administered 2019-07-28: 10:00:00 1000 mL via INTRAVENOUS

## 2019-07-28 MED ORDER — ONDANSETRON HCL 4 MG/2ML IJ SOLN
4.0000 mg | Freq: Once | INTRAMUSCULAR | Status: AC
  Administered 2019-07-28: 4 mg via INTRAVENOUS
  Filled 2019-07-28: qty 2

## 2019-07-28 MED ORDER — ACETAMINOPHEN 500 MG PO TABS
1000.0000 mg | ORAL_TABLET | Freq: Once | ORAL | Status: AC
  Administered 2019-07-28: 10:00:00 1000 mg via ORAL
  Filled 2019-07-28: qty 2

## 2019-07-28 MED ORDER — CEFDINIR 300 MG PO CAPS: 300.0000 mg | ORAL_CAPSULE | Freq: Two times a day (BID) | ORAL | 0 refills | Status: AC

## 2019-07-28 MED ORDER — OXYCODONE HCL 5 MG PO TABS: 5.0000 mg | ORAL_TABLET | Freq: Three times a day (TID) | ORAL | 0 refills | Status: AC | PRN

## 2019-07-28 MED ORDER — ONDANSETRON HCL 4 MG PO TABS: 4.0000 mg | ORAL_TABLET | Freq: Every day | ORAL | 1 refills | Status: AC | PRN

## 2019-07-28 MED ORDER — SODIUM CHLORIDE 0.9 % IV SOLN
1.0000 g | Freq: Once | INTRAVENOUS | Status: AC
  Administered 2019-07-28: 12:00:00 1 g via INTRAVENOUS
  Filled 2019-07-28: qty 10

## 2019-07-28 MED ORDER — FENTANYL CITRATE (PF) 100 MCG/2ML IJ SOLN
50.0000 ug | Freq: Once | INTRAMUSCULAR | Status: AC
  Administered 2019-07-28: 10:00:00 50 ug via INTRAVENOUS
  Filled 2019-07-28: qty 2

## 2019-07-28 MED ORDER — KETOROLAC TROMETHAMINE 15 MG/ML IJ SOLN
15.0000 mg | Freq: Once | INTRAMUSCULAR | Status: AC
  Administered 2019-07-28: 12:00:00 15 mg via INTRAVENOUS
  Filled 2019-07-28: qty 1

## 2019-07-28 MED ORDER — KETOROLAC TROMETHAMINE 30 MG/ML IJ SOLN
INTRAMUSCULAR | Status: AC
  Filled 2019-07-28: qty 1

## 2019-07-28 NOTE — Telephone Encounter (Addendum)
Summary: Medication questions    Pt was discharged from ED and has questions concerning medications. Please advise      Pt had been discharged from the hospital today after being diagnosed with pyelonephritis. She had a question regarding how to take her antibiotic and Zofran. Advised her to start taking the antibiotic since she had received the first dose in the ED this morning and continue to take them tomorrow as prescribed. Also to take the Zofran once a day as needed and if she feels like it is not working to let that provider now. She voiced understanding. Routing to [facility] for review.

## 2019-07-28 NOTE — ED Notes (Signed)
Report given to Sam, RN

## 2019-07-28 NOTE — Discharge Instructions (Addendum)
Thank you for letting us take care of you in the emergency department today.   Please continue to take any regular, prescribed medications.   New medications we have prescribed:  - Omnicef (cefdinir) - antibiotic for your kidney infection, please take the entire course as directed - Zofran, take as needed for nausea - Oxycodone, take as needed for pain control but does not respond to over-the-counter ibuprofen or Tylenol  Please follow up with: - Your primary care doctor to review your ER visit and follow up on your symptoms.   Please return to the ER for any new or worsening symptoms.

## 2019-07-28 NOTE — ED Notes (Signed)
Pt provided food and drink for PO challenge.

## 2019-07-28 NOTE — ED Provider Notes (Signed)
[facility] Emergency Department Provider Note  ____________________________________________   None    (approximate)  I have reviewed the triage vital signs and the nursing notes.  History  Chief Complaint Urinary Frequency and Back Pain    HPI Caitlin Reid is a 38 y.o. female with a history of nephrolithiasis, anxiety, PTSD, ADHD, chronic back pain pain who presents emergency department for acute on chronic low back pain, fevers/chills, and urinary frequency.  Pain is across her lower back, dull and achy, no alleviating or aggravating factors.  She reports symptoms have been ongoing for the last several days, and feels similar to when she has had a UTI in the past.  She also reports a mild associated headache.  She denies any associated weakness, numbness, tingling.  No respiratory symptoms, no vomiting or diarrhea.  No sick contacts.         Past Medical Hx Past Medical History:  Diagnosis Date  . ADHD (attention deficit hyperactivity disorder)   . Allergy   . Anxiety   . Foot pain, left   . Fracture of coccyx (HCC)   . Heel spur left  . Insomnia   . Kidney stones   . PID (acute pelvic inflammatory disease)   . Pleurisy    Patient reports that she has had this twice  . PTSD (post-traumatic stress disorder)     Problem List Patient Active Problem List   Diagnosis Date Noted  . ADHD (attention deficit hyperactivity disorder) 06/12/2018  . Insomnia 08/19/2017  . Tobacco use 05/09/2017  . Depression, major, single episode, moderate (HCC) 03/13/2017  . Violation of controlled substance agreement 03/12/2017  . Generalized anxiety disorder 03/12/2017  . Chronic pain 04/10/2016  . Long term current use of opiate analgesic 04/10/2016  . Long term prescription opiate use 04/10/2016  . Opiate use (22.5 MME/Day) 04/10/2016  . Encounter for therapeutic drug level monitoring 04/10/2016  . Encounter for pain management planning 04/10/2016  .  Chronic low back pain (Location of Primary Source of Pain) (Right) 04/10/2016  . Lumbar spondylosis (L5-S1 DDD) 04/10/2016  . Chronic sacroiliac joint pain (Right) 04/10/2016  . Lumbar facet syndrome (Right) 04/10/2016  . Chronic knee pain (Location of Tertiary source of pain) (Right) 04/10/2016  . Levoscoliosis 04/10/2016  . Lumbar facet arthropathy (L3-4 and L5-S1) 04/10/2016  . L5-S1 lumbar bulging disc (Right) 04/10/2016  . Chronic lower extremity pain (Right) 04/10/2016  . Chronic hip pain (Location of Secondary source of pain) (Right) 04/10/2016  . Chronic lumbar radicular pain (Right) 04/10/2016    Past Surgical Hx Past Surgical History:  Procedure Laterality Date  . Bone Spur    . CESAREAN SECTION    . TUBAL LIGATION      Medications Prior to Admission medications   Medication Sig Start Date End Date Taking? Authorizing Provider  ALPRAZolam Prudy Feeler) 1 MG tablet Take 1 tablet (1 mg total) by mouth 3 (three) times daily as needed for anxiety. 03/13/18   Particia Nearing, PA-C  furosemide (LASIX) 20 MG tablet Take 1 tablet (20 mg total) by mouth daily. 02/12/18   Particia Nearing, PA-C    Allergies Doxycycline, Tetracyclines & related, Hydrocodone-acetaminophen, and Other  Family Hx No family history on file.  Social Hx Social History   Tobacco Use  . Smoking status: Current Every Day Smoker    Packs/day: 0.25    Types: Cigarettes  . Smokeless tobacco: Never Used  Substance Use Topics  . Alcohol use: No  .  Drug use: No     Review of Systems  Constitutional: + for fever. + for chills. Eyes: Negative for visual changes. ENT: Negative for sore throat. Cardiovascular: Negative for chest pain. Respiratory: Negative for shortness of breath. Gastrointestinal: Negative for abdominal pain. Negative for nausea. Negative for vomiting. Genitourinary: Negative for dysuria. + Urinary frequency Musculoskeletal: Negative for leg swelling. + Low back pain Skin:  Negative for rash. Neurological: + for for headaches.   Physical Exam  Vital Signs: ED Triage Vitals  Enc Vitals Group     BP 07/28/19 0910 123/62     Pulse Rate 07/28/19 0910 (!) 106     Resp 07/28/19 0910 18     Temp 07/28/19 0910 (!) 101 F (38.3 C)     Temp Source 07/28/19 0910 Oral     SpO2 07/28/19 0910 96 %     Weight 07/28/19 0911 200 lb (90.7 kg)     Height 07/28/19 0911 5\' 2"  (1.575 m)     Head Circumference --      Peak Flow --      Pain Score 07/28/19 0911 10     Pain Loc --      Pain Edu? --      Excl. in [place]? --     Constitutional: Alert and oriented.  Eyes: Conjunctivae clear. Sclera anicteric. Head: Normocephalic. Atraumatic. Nose: No congestion. No rhinorrhea. Mouth/Throat: Mucous membranes are dry.  Neck: No stridor.   Cardiovascular: Heart rate low 100s, regular rhythm.  Extremities well perfused. Respiratory: Normal respiratory effort.  Lungs CTAB. Gastrointestinal: Soft and non-tender. No distention.  No discrete CVA tenderness. Musculoskeletal: No lower extremity edema.  Paraspinal lumbar muscles tender to palpation, no midline tenderness. Neurologic:  Normal speech and language. No gross focal neurologic deficits are appreciated.  Bilateral lower extremity strength 5/5 and symmetric.  Sensation intact to light touch.  Ambulatory with steady gait. Skin: Skin is warm, dry and intact. No rash noted. Psychiatric: Mood and affect are appropriate for situation.  EKG  N/A   Radiology  CT Renal: IMPRESSION: 1. No evidence of obstructive uropathy. 2. 1-2 mm nonobstructive left lower pole renal calculus. 3. Right perirenal fat stranding, query pyelonephritis. Please correlate to urinalysis. 4. Hepatic steatosis. 5. Probable layering gallstones without secondary signs of acute cholecystitis.     Procedures  Procedure(s) performed (including critical care):  Procedures   Initial Impression / Assessment and Plan / ED Course  38 y.o. female  who presents to the ED for fevers, chills, low back pain, urinary frequency, as above.  Ddx: UTI, pyelonephritis, nephrolithiasis, acute on chronic back pain.  No associated neurological symptoms suggestive of cord pathology.  Plan: Labs, urine, symptomatic treatment, CT renal stone study  Urinalysis consistent with infection, given dose of IV antibiotics.  Imaging is suggestive of pyelonephritis, which fits with her clinical picture.  No obstructive nephrolithiasis on imaging.  Heart rate and temperature improved with symptomatic treatment, antipyretics.  Patient would prefer outpatient management of her symptoms.  Given improvement in her vital signs, and her ability to tolerate PO here, feel this is appropriate.  We will plan for discharge with course of antibiotics, Zofran, pain medication as needed.  Discussed strict return precautions.  Patient voices understanding and is comfortable to plan and discharge.   Final Clinical Impression(s) / ED Diagnosis  Final diagnoses:  Pyelonephritis       Note:  This document was prepared using Dragon voice recognition software and may include unintentional dictation errors.  Miguel Aschoff., MD 07/28/19 [phone]

## 2019-07-28 NOTE — Telephone Encounter (Signed)
  Reason for Disposition . General information question, no triage required and triager able to answer question  Answer Assessment - Initial Assessment Questions 1. REASON FOR CALL or QUESTION: "What is your reason for calling today?" or "How can I best help you?" or "What question do you have that I can help answer?"     How to take her antibiotic and anti-nausea medication  Protocols used: [place]

## 2019-07-28 NOTE — ED Notes (Signed)
Patient transported to CT 

## 2019-07-28 NOTE — ED Notes (Signed)
Dr. Monks at bedside 

## 2019-07-28 NOTE — ED Triage Notes (Signed)
Pt comes into the Ed via EMS from home with c/o lower back pain, increased urinary frequency, HA since last night and fatigue for the past 4 days. EMS reports pt had the heat on in her home.

## 2019-07-29 ENCOUNTER — Ambulatory Visit: Payer: Self-pay

## 2019-07-29 NOTE — Telephone Encounter (Signed)
Routing to provider as an Pharmacist, hospital. Nurse at [facility] advised patient on medications.

## 2019-07-29 NOTE — Telephone Encounter (Signed)
Incoming call  From Patient with complaint of back pain related to double kidney infection. Patient was seen at [facility].  Yesterday .  Reports the following fevers 103 yesterday.  99.5, yesterday evening.102.3 during triage 100.1 Rates back pain.  Severe.  Reports constant pain that radiates to knees. Repotsrts urgency, and thirsty.  Hydrating herself fwell.    Reviewed  Protocol which recommended pt. Go back to ED for further  Evaluation.   Pt. Voiced understanding.           Reason for Disposition . [1] Fever AND [2] no symptoms of UTI  (Exception: has generalized muscle pains, not localized back pain)  Answer Assessment - Initial Assessment Questions 1. ONSET: "When did the pain begin?"     5 to 6 days 2. LOCATION: "Where does it hurt?" (upper, mid or lower back)     Back pain 3. SEVERITY: "How bad is the pain?"  (e.g., Scale 1-10; mild, moderate, or severe)   - MILD (1-3): doesn't interfere with normal activities    - MODERATE (4-7): interferes with normal activities or awakens from sleep    - SEVERE (8-10): excruciating pain, unable to do any normal activities     severe 4. PATTERN: "Is the pain constant?" (e.g., yes, no; constant, intermittent)      constant 5. RADIATION: "Does the pain shoot into your legs or elsewhere?"     knees 6. CAUSE:  "What do you think is causing the back pain?"      *No Answer* 7. BACK OVERUSE:  "Any recent lifting of heavy objects, strenuous work or exercise?"     *No Answer* 8. MEDICATIONS: "What have you taken so far for the pain?" (e.g., nothing, acetaminophen, NSAIDS)     Yes antibiotic  9. NEUROLOGIC SYMPTOMS: "Do you have any weakness, numbness, or problems with bowel/bladder control?"     Bulging disc, voiding every 5 min.  10. OTHER SYMPTOMS: "Do you have any other symptoms?" (e.g., fever, abdominal pain, burning with urination, blood in urine)      urgency 11. PREGNANCY: "Is there any chance you are pregnant?" (e.g.,  yes, no; LMP)     tubal  Protocols used: BACK PAIN-A-AH

## 2019-07-30 LAB — URINE CULTURE: Culture: 60000 — AB

## 2019-10-29 ENCOUNTER — Emergency Department: Payer: Medicaid Other

## 2019-10-29 ENCOUNTER — Other Ambulatory Visit: Payer: Self-pay

## 2019-10-29 ENCOUNTER — Emergency Department
Admission: EM | Admit: 2019-10-29 | Discharge: 2019-10-29 | Disposition: A | Discharge status: Home / Self Care | Payer: Medicaid Other | Attending: Emergency Medicine | Admitting: Emergency Medicine

## 2019-10-29 DIAGNOSIS — M25462 Effusion, left knee: Secondary | ICD-10-CM | POA: Diagnosis not present

## 2019-10-29 DIAGNOSIS — Y939 Activity, unspecified: Secondary | ICD-10-CM | POA: Diagnosis not present

## 2019-10-29 DIAGNOSIS — Y929 Unspecified place or not applicable: Secondary | ICD-10-CM | POA: Insufficient documentation

## 2019-10-29 DIAGNOSIS — Y999 Unspecified external cause status: Secondary | ICD-10-CM | POA: Diagnosis not present

## 2019-10-29 DIAGNOSIS — S90415A Abrasion, left lesser toe(s), initial encounter: Secondary | ICD-10-CM | POA: Insufficient documentation

## 2019-10-29 DIAGNOSIS — F17210 Nicotine dependence, cigarettes, uncomplicated: Secondary | ICD-10-CM | POA: Diagnosis not present

## 2019-10-29 DIAGNOSIS — S0990XA Unspecified injury of head, initial encounter: Secondary | ICD-10-CM | POA: Diagnosis present

## 2019-10-29 DIAGNOSIS — Z79899 Other long term (current) drug therapy: Secondary | ICD-10-CM | POA: Insufficient documentation

## 2019-10-29 DIAGNOSIS — S0083XA Contusion of other part of head, initial encounter: Secondary | ICD-10-CM | POA: Insufficient documentation

## 2019-10-29 MED ORDER — CYCLOBENZAPRINE HCL 10 MG PO TABS
5.0000 mg | ORAL_TABLET | Freq: Once | ORAL | Status: AC
  Administered 2019-10-29: 5 mg via ORAL
  Filled 2019-10-29: qty 1

## 2019-10-29 MED ORDER — OXYCODONE HCL 5 MG PO TABS
5.0000 mg | ORAL_TABLET | Freq: Once | ORAL | Status: DC
  Filled 2019-10-29: qty 1

## 2019-10-29 MED ORDER — TETANUS-DIPHTH-ACELL PERTUSSIS 5-2.5-18.5 LF-MCG/0.5 IM SUSP
0.5000 mL | Freq: Once | INTRAMUSCULAR | Status: DC
  Filled 2019-10-29: qty 0.5

## 2019-10-29 MED ORDER — CYCLOBENZAPRINE HCL 5 MG PO TABS: 5.0000 mg | ORAL_TABLET | Freq: Three times a day (TID) | ORAL | 0 refills | Status: AC | PRN

## 2019-10-29 MED ORDER — ACETAMINOPHEN 500 MG PO TABS
1000.0000 mg | ORAL_TABLET | Freq: Once | ORAL | Status: AC
  Administered 2019-10-29: 1000 mg via ORAL
  Filled 2019-10-29: qty 2

## 2019-10-29 MED ORDER — LIDOCAINE 5 % EX PTCH
1.0000 | MEDICATED_PATCH | CUTANEOUS | Status: DC
  Administered 2019-10-29: 19:00:00 1 via TRANSDERMAL
  Filled 2019-10-29 (×2): qty 1

## 2019-10-29 MED ORDER — ALPRAZOLAM 0.5 MG PO TABS
1.0000 mg | ORAL_TABLET | Freq: Once | ORAL | Status: AC
  Administered 2019-10-29: 1 mg via ORAL
  Filled 2019-10-29: qty 2

## 2019-10-29 MED ORDER — KETOROLAC TROMETHAMINE 30 MG/ML IJ SOLN
30.0000 mg | Freq: Once | INTRAMUSCULAR | Status: AC
  Administered 2019-10-29: 18:00:00 30 mg via INTRAMUSCULAR
  Filled 2019-10-29: qty 1

## 2019-10-29 NOTE — ED Notes (Signed)
Pt left room ambulatory not using crutches "I am not very good using crutches" RN explained pt proper use of crutches, pt upset because she did not get a prescription for Xanax, RN encouraged pt to see PCP reports does not have a PCP, enc [facility], provided names of different clinics

## 2019-10-29 NOTE — ED Notes (Signed)
Pt observed by this RN walking briskly around nurses station.
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
## 2019-10-29 NOTE — ED Notes (Signed)
Pt ambulated to nurses station and back into her room without difficult or assistance. No limp noted.

## 2019-10-29 NOTE — ED Notes (Signed)
Pt attempting to get a snack from vending machine unsuccessfully and asking where the cafeteria is and walked down for a snack

## 2019-10-29 NOTE — ED Notes (Signed)
Pt seen walking around nurses station and towards hallway for main ED. Pt has belongings with her
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
## 2019-10-29 NOTE — Discharge Instructions (Signed)
Your work-up was reassuring.  You should follow-up with your primary care doctor for anxiety.  Take 1 g of Tylenol every 8 hours and ibuprofen 800 every 8 hours for the next week to help with your pain.  Take the Flexeril for breakthrough pain.  Do not drive or work while on this.  Return to the ER for any other concerns

## 2019-11-23 ENCOUNTER — Other Ambulatory Visit: Payer: Self-pay

## 2019-11-29 ENCOUNTER — Other Ambulatory Visit: Payer: Self-pay

## 2020-04-06 ENCOUNTER — Encounter: Payer: Self-pay | Admitting: *Deleted

## 2020-04-06 ENCOUNTER — Emergency Department
Admission: EM | Admit: 2020-04-06 | Discharge: 2020-04-06 | Disposition: A | Discharge status: Home / Self Care | Payer: Medicaid Other | Attending: Emergency Medicine | Admitting: Emergency Medicine

## 2020-04-06 ENCOUNTER — Other Ambulatory Visit: Payer: Self-pay

## 2020-04-06 DIAGNOSIS — Z20822 Contact with and (suspected) exposure to covid-19: Secondary | ICD-10-CM | POA: Insufficient documentation

## 2020-04-06 DIAGNOSIS — R0981 Nasal congestion: Secondary | ICD-10-CM | POA: Insufficient documentation

## 2020-04-06 DIAGNOSIS — R05 Cough: Secondary | ICD-10-CM | POA: Diagnosis not present

## 2020-04-06 DIAGNOSIS — J0100 Acute maxillary sinusitis, unspecified: Secondary | ICD-10-CM | POA: Insufficient documentation

## 2020-04-06 DIAGNOSIS — J069 Acute upper respiratory infection, unspecified: Secondary | ICD-10-CM | POA: Diagnosis not present

## 2020-04-06 DIAGNOSIS — F17210 Nicotine dependence, cigarettes, uncomplicated: Secondary | ICD-10-CM | POA: Insufficient documentation

## 2020-04-06 DIAGNOSIS — J029 Acute pharyngitis, unspecified: Secondary | ICD-10-CM | POA: Diagnosis present

## 2020-04-06 LAB — GROUP A STREP BY PCR: Group A Strep by PCR: NOT DETECTED

## 2020-04-06 MED ORDER — AMOXICILLIN-POT CLAVULANATE 875-125 MG PO TABS
1.0000 | ORAL_TABLET | Freq: Two times a day (BID) | ORAL | 0 refills | Status: DC
Start: 2020-04-06 — End: 2020-04-06

## 2020-04-06 MED ORDER — PREDNISONE 20 MG PO TABS
60.0000 mg | ORAL_TABLET | Freq: Once | ORAL | Status: AC
  Administered 2020-04-06: 60 mg via ORAL
  Filled 2020-04-06: qty 3

## 2020-04-06 MED ORDER — AMOXICILLIN-POT CLAVULANATE 875-125 MG PO TABS: 1.0000 | ORAL_TABLET | Freq: Two times a day (BID) | ORAL | 0 refills | Status: AC

## 2020-04-06 MED ORDER — PREDNISONE 10 MG PO TABS
10.0000 mg | ORAL_TABLET | Freq: Every day | ORAL | 0 refills | Status: DC
Start: 2020-04-06 — End: 2020-04-06

## 2020-04-06 MED ORDER — PREDNISONE 10 MG PO TABS: 10.0000 mg | ORAL_TABLET | Freq: Every day | ORAL | 0 refills | Status: AC

## 2020-04-06 MED ORDER — AMOXICILLIN-POT CLAVULANATE 875-125 MG PO TABS
1.0000 | ORAL_TABLET | Freq: Once | ORAL | Status: AC
  Administered 2020-04-06: 1 via ORAL
  Filled 2020-04-06: qty 1

## 2020-04-06 NOTE — Discharge Instructions (Addendum)
Please take medications as prescribed.  Make sure you are drinking lots of fluids.  Return to the ER for any fevers worsening symptoms or urgent changes in your health such as chest pain shortness of breath or difficulty swallowing.

## 2020-04-06 NOTE — ED Provider Notes (Signed)
[facility] EMERGENCY DEPARTMENT Provider Note   CSN: [id number] Arrival date & time: 04/06/20  1831     History Chief Complaint  Patient presents with  . Sore Throat    Caitlin Reid is a 39 y.o. female presents to the emergency department evaluation of 1 week of cough, congestion, sinus pain and pressure, sore throat.  Symptoms been present for 1 week.  She denies any fevers.  Cough is dry nonproductive.  She denies any wheezing, chest pain or shortness of breath.  No chills, body aches, nausea or vomiting  HPI     Past Medical History:  Diagnosis Date  . ADHD (attention deficit hyperactivity disorder)   . Allergy   . Anxiety   . Foot pain, left   . Fracture of coccyx (HCC)   . Heel spur left  . Insomnia   . Kidney stones   . PID (acute pelvic inflammatory disease)   . Pleurisy    Patient reports that she has had this twice  . PTSD (post-traumatic stress disorder)     Patient Active Problem List   Diagnosis Date Noted  . ADHD (attention deficit hyperactivity disorder) 06/12/2018  . Insomnia 08/19/2017  . Tobacco use 05/09/2017  . Depression, major, single episode, moderate (HCC) 03/13/2017  . Violation of controlled substance agreement 03/12/2017  . Generalized anxiety disorder 03/12/2017  . Chronic pain 04/10/2016  . Long term current use of opiate analgesic 04/10/2016  . Long term prescription opiate use 04/10/2016  . Opiate use (22.5 MME/Day) 04/10/2016  . Encounter for therapeutic drug level monitoring 04/10/2016  . Encounter for pain management planning 04/10/2016  . Chronic low back pain (Location of Primary Source of Pain) (Right) 04/10/2016  . Lumbar spondylosis (L5-S1 DDD) 04/10/2016  . Chronic sacroiliac joint pain (Right) 04/10/2016  . Lumbar facet syndrome (Right) 04/10/2016  . Chronic knee pain (Location of Tertiary source of pain) (Right) 04/10/2016  . Levoscoliosis 04/10/2016  . Lumbar facet arthropathy (L3-4 and  L5-S1) 04/10/2016  . L5-S1 lumbar bulging disc (Right) 04/10/2016  . Chronic lower extremity pain (Right) 04/10/2016  . Chronic hip pain (Location of Secondary source of pain) (Right) 04/10/2016  . Chronic lumbar radicular pain (Right) 04/10/2016    Past Surgical History:  Procedure Laterality Date  . Bone Spur    . CESAREAN SECTION    . TUBAL LIGATION       OB History   No obstetric history on file.     No family history on file.  Social History   Tobacco Use  . Smoking status: Current Every Day Smoker    Packs/day: 0.25    Types: Cigarettes  . Smokeless tobacco: Never Used  Substance Use Topics  . Alcohol use: No  . Drug use: No    Home Medications Prior to Admission medications   Medication Sig Start Date End Date Taking? Authorizing Provider  ALPRAZolam Prudy Feeler) 1 MG tablet Take 1 tablet (1 mg total) by mouth 3 (three) times daily as needed for anxiety. 03/13/18   Particia Nearing, PA-C  amoxicillin-clavulanate (AUGMENTIN) 875-125 MG tablet Take 1 tablet by mouth every 12 (twelve) hours for 7 days. 04/06/20 04/13/20  Evon Slack, PA-C  cyclobenzaprine (FLEXERIL) 5 MG tablet Take 1 tablet (5 mg total) by mouth 3 (three) times daily as needed for muscle spasms. 10/29/19   Concha Se, MD  furosemide (LASIX) 20 MG tablet Take 1 tablet (20 mg total) by mouth daily. 02/12/18   Maurice March,  Lilia Argue, PA-C  ondansetron (ZOFRAN) 4 MG tablet Take 1 tablet (4 mg total) by mouth daily as needed for nausea or vomiting. 07/28/19 07/27/20  Lilia Pro., MD  predniSONE (DELTASONE) 10 MG tablet Take 1 tablet (10 mg total) by mouth daily. 6,5,4,3,2,1 six day taper 04/06/20   Duanne Guess, PA-C    Allergies    Doxycycline, Tetracyclines & related, Hydrocodone-acetaminophen, and Other  Review of Systems   Review of Systems  Constitutional: Negative for fever.  HENT: Positive for congestion, rhinorrhea, sinus pressure, sinus pain and sore throat. Negative for ear  discharge, trouble swallowing and voice change.   Respiratory: Positive for cough. Negative for shortness of breath, wheezing and stridor.   Cardiovascular: Negative for chest pain.  Gastrointestinal: Negative for abdominal pain, diarrhea, nausea and vomiting.  Genitourinary: Negative for dysuria, flank pain and pelvic pain.  Musculoskeletal: Negative for back pain and myalgias.  Skin: Negative for rash.  Neurological: Negative for dizziness and headaches.    Physical Exam Updated Vital Signs BP (!) 171/94 (BP Location: Left Arm)   Pulse 84   Temp 98.3 F (36.8 C) (Oral)   Resp 16   Wt 86.2 kg   SpO2 99%   BMI 34.75 kg/m   Physical Exam Constitutional:      General: She is not in acute distress.    Appearance: She is well-developed.  HENT:     Head: Normocephalic and atraumatic.     Jaw: No trismus.     Comments: Positive maxillary sinus tenderness bilaterally.    Right Ear: Hearing, tympanic membrane, ear canal and external ear normal.     Left Ear: Hearing, tympanic membrane, ear canal and external ear normal.     Nose: Rhinorrhea present.     Mouth/Throat:     Pharynx: Posterior oropharyngeal erythema present. No oropharyngeal exudate or uvula swelling.     Tonsils: No tonsillar exudate or tonsillar abscesses.  Eyes:     General:        Right eye: No discharge.        Left eye: No discharge.     Conjunctiva/sclera: Conjunctivae normal.  Cardiovascular:     Rate and Rhythm: Normal rate and regular rhythm.     Heart sounds: Normal heart sounds.  Pulmonary:     Effort: Pulmonary effort is normal. No respiratory distress.     Breath sounds: Normal breath sounds. No stridor. No wheezing or rales.  Abdominal:     General: There is no distension.     Palpations: Abdomen is soft.     Tenderness: There is no abdominal tenderness.  Musculoskeletal:        General: No deformity. Normal range of motion.     Cervical back: Normal range of motion.  Lymphadenopathy:      Cervical: Cervical adenopathy present.  Skin:    General: Skin is warm and dry.  Neurological:     Mental Status: She is alert and oriented to person, place, and time.     Deep Tendon Reflexes: Reflexes are normal and symmetric.  Psychiatric:        Behavior: Behavior normal.        Thought Content: Thought content normal.     ED Results / Procedures / Treatments   Labs (all labs ordered are listed, but only abnormal results are displayed) Labs Reviewed  GROUP A STREP BY PCR  SARS CORONAVIRUS 2 (TAT 6-24 HRS)    EKG None  Radiology No results found.  Procedures Procedures (including critical care time)  Medications Ordered in ED Medications  amoxicillin-clavulanate (AUGMENTIN) 875-125 MG per tablet 1 tablet (1 tablet Oral Given 04/06/20 2138)  predniSONE (DELTASONE) tablet 60 mg (60 mg Oral Given 04/06/20 2138)    ED Course  I have reviewed the triage vital signs and the nursing notes.  Pertinent labs & imaging results that were available during my care of the patient were reviewed by me and considered in my medical decision making (see chart for details).    MDM Rules/Calculators/A&P                      39 year old female with upper respiratory infection x1 week.  She appears to have acute sinusitis as she has sinus pain and pressure with tenderness on exam.  No signs of strep on exam.  Lungs are clear to auscultation and vital signs are stable.  She is placed on Augmentin and prednisone.  She will increase fluids.  She understands signs symptoms return to ED for. Final Clinical Impression(s) / ED Diagnoses Final diagnoses:  Viral upper respiratory tract infection  Acute non-recurrent maxillary sinusitis    Rx / DC Orders ED Discharge Orders         Ordered    amoxicillin-clavulanate (AUGMENTIN) 875-125 MG tablet  Every 12 hours     04/06/20 2148    predniSONE (DELTASONE) 10 MG tablet  Daily     04/06/20 2148           Ronnette Juniper 04/06/20  2150    Chesley Noon, MD 04/07/20 0000

## 2020-04-06 NOTE — ED Triage Notes (Signed)
Pt is here for sore throat x a couple of days that is constant.  No fever or chills with this.

## 2020-04-07 LAB — SARS CORONAVIRUS 2 (TAT 6-24 HRS): SARS Coronavirus 2: NEGATIVE

## 2020-05-01 ENCOUNTER — Emergency Department: Payer: Medicaid Other

## 2020-05-01 ENCOUNTER — Other Ambulatory Visit: Payer: Self-pay

## 2020-05-01 ENCOUNTER — Emergency Department
Admission: EM | Admit: 2020-05-01 | Discharge: 2020-05-01 | Disposition: A | Discharge status: Home / Self Care | Payer: Medicaid Other | Attending: Student | Admitting: Student

## 2020-05-01 ENCOUNTER — Encounter: Payer: Self-pay | Admitting: Emergency Medicine

## 2020-05-01 DIAGNOSIS — R531 Weakness: Secondary | ICD-10-CM | POA: Insufficient documentation

## 2020-05-01 DIAGNOSIS — Z20822 Contact with and (suspected) exposure to covid-19: Secondary | ICD-10-CM | POA: Diagnosis not present

## 2020-05-01 DIAGNOSIS — F17210 Nicotine dependence, cigarettes, uncomplicated: Secondary | ICD-10-CM | POA: Insufficient documentation

## 2020-05-01 DIAGNOSIS — Z79899 Other long term (current) drug therapy: Secondary | ICD-10-CM | POA: Insufficient documentation

## 2020-05-01 DIAGNOSIS — J029 Acute pharyngitis, unspecified: Secondary | ICD-10-CM | POA: Insufficient documentation

## 2020-05-01 DIAGNOSIS — Z79891 Long term (current) use of opiate analgesic: Secondary | ICD-10-CM | POA: Insufficient documentation

## 2020-05-01 LAB — URINALYSIS, COMPLETE (UACMP) WITH MICROSCOPIC
Bacteria, UA: NONE SEEN
Bilirubin Urine: NEGATIVE
Glucose, UA: NEGATIVE mg/dL
Ketones, ur: NEGATIVE mg/dL
Leukocytes,Ua: NEGATIVE
Nitrite: NEGATIVE
Protein, ur: NEGATIVE mg/dL
Specific Gravity, Urine: 1.013 (ref 1.005–1.030)
pH: 8 (ref 5.0–8.0)

## 2020-05-01 LAB — CBC WITH DIFFERENTIAL/PLATELET
Abs Immature Granulocytes: 0.04 10*3/uL (ref 0.00–0.07)
Basophils Absolute: 0 10*3/uL (ref 0.0–0.1)
Basophils Relative: 0 %
Eosinophils Absolute: 0.1 10*3/uL (ref 0.0–0.5)
Eosinophils Relative: 1 %
HCT: 40.2 % (ref 36.0–46.0)
Hemoglobin: 13.4 g/dL (ref 12.0–15.0)
Immature Granulocytes: 0 %
Lymphocytes Relative: 15 %
Lymphs Abs: 1.5 10*3/uL (ref 0.7–4.0)
MCH: 29.8 pg (ref 26.0–34.0)
MCHC: 33.3 g/dL (ref 30.0–36.0)
MCV: 89.3 fL (ref 80.0–100.0)
Monocytes Absolute: 0.3 10*3/uL (ref 0.1–1.0)
Monocytes Relative: 3 %
Neutro Abs: 8 10*3/uL — ABNORMAL HIGH (ref 1.7–7.7)
Neutrophils Relative %: 81 %
Platelets: 304 10*3/uL (ref 150–400)
RBC: 4.5 MIL/uL (ref 3.87–5.11)
RDW: 12.3 % (ref 11.5–15.5)
WBC: 10 10*3/uL (ref 4.0–10.5)
nRBC: 0 % (ref 0.0–0.2)

## 2020-05-01 LAB — COMPREHENSIVE METABOLIC PANEL
ALT: 18 U/L (ref 0–44)
AST: 23 U/L (ref 15–41)
Albumin: 3.6 g/dL (ref 3.5–5.0)
Alkaline Phosphatase: 44 U/L (ref 38–126)
Anion gap: 10 (ref 5–15)
BUN: 10 mg/dL (ref 6–20)
CO2: 25 mmol/L (ref 22–32)
Calcium: 8.1 mg/dL — ABNORMAL LOW (ref 8.9–10.3)
Chloride: 100 mmol/L (ref 98–111)
Creatinine, Ser: 0.67 mg/dL (ref 0.44–1.00)
GFR calc Af Amer: >60 mL/min (ref >60)
GFR calc non Af Amer: >60 mL/min (ref >60)
Glucose, Bld: 168 mg/dL — ABNORMAL HIGH (ref 70–99)
Potassium: 3.8 mmol/L (ref 3.5–5.1)
Sodium: 135 mmol/L (ref 135–145)
Total Bilirubin: 0.9 mg/dL (ref 0.3–1.2)
Total Protein: 6.4 g/dL — ABNORMAL LOW (ref 6.5–8.1)

## 2020-05-01 LAB — POCT PREGNANCY, URINE: Preg Test, Ur: NEGATIVE

## 2020-05-01 LAB — SARS CORONAVIRUS 2 BY RT PCR (HOSPITAL ORDER, PERFORMED IN ~~LOC~~ HOSPITAL LAB): SARS Coronavirus 2: NEGATIVE

## 2020-05-01 LAB — LACTIC ACID, PLASMA: Lactic Acid, Venous: 1.9 mmol/L (ref 0.5–1.9)

## 2020-05-01 LAB — MONONUCLEOSIS SCREEN: Mono Screen: NEGATIVE

## 2020-05-01 MED ORDER — ALUM & MAG HYDROXIDE-SIMETH 200-200-20 MG/5ML PO SUSP
30.0000 mL | Freq: Once | ORAL | Status: AC
  Administered 2020-05-01: 30 mL via ORAL
  Filled 2020-05-01: qty 30

## 2020-05-01 MED ORDER — ONDANSETRON HCL 4 MG/2ML IJ SOLN
4.0000 mg | Freq: Once | INTRAMUSCULAR | Status: AC
  Administered 2020-05-01: 4 mg via INTRAVENOUS
  Filled 2020-05-01: qty 2

## 2020-05-01 MED ORDER — GUAIFENESIN 100 MG/5ML PO SOLN: 5.0000 mL | ORAL | 0 refills | Status: AC | PRN

## 2020-05-01 MED ORDER — SODIUM CHLORIDE 0.9% FLUSH: 3.0000 mL | Freq: Once | INTRAVENOUS | Status: DC

## 2020-05-01 MED ORDER — IOHEXOL 300 MG/ML  SOLN
75.0000 mL | Freq: Once | INTRAMUSCULAR | Status: AC | PRN
  Administered 2020-05-01: 75 mL via INTRAVENOUS
  Filled 2020-05-01: qty 75

## 2020-05-01 MED ORDER — FENTANYL CITRATE (PF) 100 MCG/2ML IJ SOLN
50.0000 ug | Freq: Once | INTRAMUSCULAR | Status: AC
  Administered 2020-05-01: 50 ug via INTRAVENOUS
  Filled 2020-05-01: qty 2

## 2020-05-01 MED ORDER — BENZONATATE 100 MG PO CAPS
100.0000 mg | ORAL_CAPSULE | Freq: Three times a day (TID) | ORAL | 0 refills | Status: AC | PRN
Start: 2020-05-01 — End: 2021-05-01

## 2020-05-01 MED ORDER — SODIUM CHLORIDE 0.9 % IV BOLUS
1000.0000 mL | Freq: Once | INTRAVENOUS | Status: AC
  Administered 2020-05-01: 1000 mL via INTRAVENOUS

## 2020-05-01 MED ORDER — LIDOCAINE VISCOUS HCL 2 % MT SOLN
15.0000 mL | Freq: Once | OROMUCOSAL | Status: AC
  Administered 2020-05-01: 15 mL via ORAL
  Filled 2020-05-01: qty 15

## 2020-05-01 NOTE — ED Triage Notes (Signed)
Pt from home with a sore throat that has never resolved since her last visit on 5/20. She states she was given antibiotics on her last visit, and that it has gotten worse. Pt reports minimal improvement on the antibiotics and that she is now feeling much, much worse. Pt alert & oriented, nad noted.

## 2020-05-01 NOTE — ED Provider Notes (Signed)
[facility] Emergency Department Provider Note  ____________________________________________   First MD Initiated Contact with Patient 05/01/20 1912     (approximate)  I have reviewed the triage vital signs and the nursing notes.  History  Chief Complaint Sore Throat and Generalized Body Aches    HPI Caitlin Reid is a 39 y.o. female past medical history as below, who presents to the emergency department for sore throat, body aches, fatigue.  Patient states symptoms first started several weeks ago.  At initial onset she had cough, congestion, sore throat.  She was seen in this ED on 5/20 diagnosed with sinusitis and started on Augmentin and prednisone.  Strep and COVID testing negative at that time.  She states this helped her symptoms for a brief time, but they have since recurred and worsened.  She reports continued severe sore throat, she describes as a tearing sensation.  No alleviating or aggravating components.  Reports generalized fatigue and body aches.  No fevers.  Denies any COVID exposures.  She has discomfort with swallowing due to pain, but no dysphagia. No vomiting. Trying to quit smoking.    Past Medical Hx Past Medical History:  Diagnosis Date  . ADHD (attention deficit hyperactivity disorder)   . Allergy   . Anxiety   . Foot pain, left   . Fracture of coccyx (HCC)   . Heel spur left  . Insomnia   . Kidney stones   . PID (acute pelvic inflammatory disease)   . Pleurisy    Patient reports that she has had this twice  . PTSD (post-traumatic stress disorder)     Problem List Patient Active Problem List   Diagnosis Date Noted  . ADHD (attention deficit hyperactivity disorder) 06/12/2018  . Insomnia 08/19/2017  . Tobacco use 05/09/2017  . Depression, major, single episode, moderate (HCC) 03/13/2017  . Violation of controlled substance agreement 03/12/2017  . Generalized anxiety disorder 03/12/2017  . Chronic pain 04/10/2016  .  Long term current use of opiate analgesic 04/10/2016  . Long term prescription opiate use 04/10/2016  . Opiate use (22.5 MME/Day) 04/10/2016  . Encounter for therapeutic drug level monitoring 04/10/2016  . Encounter for pain management planning 04/10/2016  . Chronic low back pain (Location of Primary Source of Pain) (Right) 04/10/2016  . Lumbar spondylosis (L5-S1 DDD) 04/10/2016  . Chronic sacroiliac joint pain (Right) 04/10/2016  . Lumbar facet syndrome (Right) 04/10/2016  . Chronic knee pain (Location of Tertiary source of pain) (Right) 04/10/2016  . Levoscoliosis 04/10/2016  . Lumbar facet arthropathy (L3-4 and L5-S1) 04/10/2016  . L5-S1 lumbar bulging disc (Right) 04/10/2016  . Chronic lower extremity pain (Right) 04/10/2016  . Chronic hip pain (Location of Secondary source of pain) (Right) 04/10/2016  . Chronic lumbar radicular pain (Right) 04/10/2016    Past Surgical Hx Past Surgical History:  Procedure Laterality Date  . Bone Spur    . CESAREAN SECTION    . TUBAL LIGATION      Medications Prior to Admission medications   Medication Sig Start Date End Date Taking? Authorizing Provider  ALPRAZolam Prudy Feeler) 1 MG tablet Take 1 tablet (1 mg total) by mouth 3 (three) times daily as needed for anxiety. 03/13/18   Particia Nearing, PA-C  cyclobenzaprine (FLEXERIL) 5 MG tablet Take 1 tablet (5 mg total) by mouth 3 (three) times daily as needed for muscle spasms. 10/29/19   Concha Se, MD  furosemide (LASIX) 20 MG tablet Take 1 tablet (20 mg total)  by mouth daily. 02/12/18   Volney American, PA-C  ondansetron (ZOFRAN) 4 MG tablet Take 1 tablet (4 mg total) by mouth daily as needed for nausea or vomiting. 07/28/19 07/27/20  Lilia Pro., MD  predniSONE (DELTASONE) 10 MG tablet Take 1 tablet (10 mg total) by mouth daily. 6,5,4,3,2,1 six day taper 04/06/20   Duanne Guess, PA-C    Allergies Doxycycline, Tetracyclines & related, Hydrocodone-acetaminophen, and  Other  Family Hx History reviewed. No pertinent family history.  Social Hx Social History   Tobacco Use  . Smoking status: Current Every Day Smoker    Packs/day: 0.25    Types: Cigarettes  . Smokeless tobacco: Never Used  . Tobacco comment: trying to quit; on chantix  Vaping Use  . Vaping Use: Some days  Substance Use Topics  . Alcohol use: No  . Drug use: No     Review of Systems  Constitutional: Negative for fever. Negative for chills. + fatigue, body aches Eyes: Negative for visual changes. ENT: + for sore throat. Cardiovascular: Negative for chest pain. Respiratory: Negative for shortness of breath. Gastrointestinal: Negative for nausea. Negative for vomiting.  Genitourinary: Negative for dysuria. Musculoskeletal: Negative for leg swelling. Skin: Negative for rash. Neurological: Negative for headaches.   Physical Exam  Vital Signs: ED Triage Vitals  Enc Vitals Group     BP 05/01/20 1519 (!) 150/100     Pulse Rate 05/01/20 1519 93     Resp 05/01/20 1519 20     Temp 05/01/20 1519 99 F (37.2 C)     Temp Source 05/01/20 1519 Oral     SpO2 05/01/20 1519 98 %     Weight 05/01/20 1521 190 lb 0.6 oz (86.2 kg)     Height 05/01/20 1521 5\' 2"  (1.575 m)     Head Circumference --      Peak Flow --      Pain Score 05/01/20 1520 8     Pain Loc --      Pain Edu? --      Excl. in [place]? --     Constitutional: Alert and oriented.  NAD.  Head: Normocephalic. Atraumatic. Eyes: Conjunctivae clear. Sclera anicteric. Pupils equal and symmetric. Nose: No masses or lesions. No congestion or rhinorrhea. Mouth/Throat: Wearing mask.  No erythema of the posterior oropharynx.  No palatal lesions.  Mild tonsillar hypertrophy bilaterally, but no erythema or exudates.  Uvula midline.  Rough/hoarse voice, but no hot potato voice. Neck: No stridor. Trachea midline.  Query shotty lymphadenopathy. Cardiovascular: Normal rate, regular rhythm. Extremities well perfused. Respiratory:  Normal respiratory effort.   Gastrointestinal: Soft. Non-distended. Non-tender.  Genitourinary: Deferred. Musculoskeletal: No lower extremity edema. No deformities. Neurologic:  Normal speech and language. No gross focal or lateralizing neurologic deficits are appreciated.  Skin: Skin is warm, dry and intact. No rash noted. Psychiatric: Emotionally labile. Frustrated at the length of her symptoms.   EKG  Personally reviewed and interpreted by myself.   Date: 05/01/2020 Time: 1524 Rate: 85 Rhythm: Sinus Axis: Normal Intervals: Within normal limits No acute ischemic changes No STEMI    Radiology  Personally reviewed available imaging myself.   CT neck - IMPRESSION:  Negative CT of the neck. No acute inflammatory changes or other  abnormality identified.    Procedures  Procedure(s) performed (including critical care):  Procedures   Initial Impression / Assessment and Plan / MDM / ED Course  39 y.o. female who presents to the ED persistent sore throat, body aches,  fatigue  Ddx: COVID, pharyngitis, mono, lingering post viral symptoms.  Exam does not appear to be consistent with a peritonsillar abscess.  She has a hoarse/rough voice, but no hot potato voice to suggest retropharyngeal abscess.  She is a smoker, which is likely contributing.  Will plan for labs, imaging, pain/symptom control and reassess  CT neck is unremarkable.  Mono negative. COVID negative.  Remainder of labs without actionable derangements.  Updated patient on results.  She is tearful and frustrated that there is no clear answer to explain her symptoms at this time.  Provided reassurance that there are no acute abnormalities on her imaging, work-up does not reveal any indication for admission, antibiotics, or surgery at this time.  We will plan for discharge with prescriptions for supportive care, cough medicine, and provide referral to ENT.  She and partner at bedside voiced understanding.  Given return  precautions.   _______________________________   As part of my medical decision making I have reviewed available labs, radiology tests, reviewed old records/performed chart review, obtained additional history from partner at bedside.    Final Clinical Impression(s) / ED Diagnosis  Sore throat Body aches Fatigue     Note:  This document was prepared using Dragon voice recognition software and may include unintentional dictation errors.   Miguel Aschoff., MD 05/01/20 2127

## 2020-05-01 NOTE — ED Notes (Signed)
Pt given food and beverage per request and EDP approval. Pt tolerated PO challenge without difficulty. EDP notified

## 2020-05-01 NOTE — ED Notes (Signed)
Pt visually upset and crying stating that she is done with [facility] and that we did not treat her issues. This RN and EDP [place] attempted to explain to the pt her dx and follow up appts in regards to tx and pt was unwilling to acknowledge d/c instructions or follow up care. Pt removed her own IV and left without signing or allowing staff to re-assess her vitals. EDP notified.

## 2020-05-01 NOTE — Discharge Instructions (Addendum)
Thank you for letting us take care of you in the emergency department today.   Please continue to take any regular, prescribed medications. We will send you home w/ prescriptions to help w/ your cough.  Please follow up with: Your primary care doctor to review your ER visit and follow up on your symptoms.  ENT, information below   Please return to the ER for any new or worsening symptoms.

## 2020-05-01 NOTE — ED Notes (Signed)
Ambulates up to STAT desk stating that she is thinking about leaving. Apologized about patient wait, updated and told her we would be happy to see her. Pt went and sat back down in her waiting room chair.

## 2020-05-06 LAB — CULTURE, BLOOD (ROUTINE X 2)
Culture: NO GROWTH
Special Requests: ADEQUATE
